# Patient Record
Sex: Male | Born: 1945 | Race: White | Hispanic: No | Marital: Married | Smoking: Former smoker
Health system: Southern US, Community
[De-identification: ages and names within clinical notes are randomized; demographics above are authoritative.]

## PROBLEM LIST (undated history)

## (undated) DIAGNOSIS — E785 Hyperlipidemia, unspecified: Secondary | ICD-10-CM

## (undated) DIAGNOSIS — I10 Essential (primary) hypertension: Secondary | ICD-10-CM

## (undated) HISTORY — PX: TONSILLECTOMY: SUR1361

## (undated) HISTORY — PX: APPENDECTOMY: SHX54

---

## 2012-05-29 DIAGNOSIS — E669 Obesity, unspecified: Secondary | ICD-10-CM | POA: Insufficient documentation

## 2012-05-29 DIAGNOSIS — E876 Hypokalemia: Secondary | ICD-10-CM | POA: Insufficient documentation

## 2012-12-25 ENCOUNTER — Emergency Department (INDEPENDENT_AMBULATORY_CARE_PROVIDER_SITE_OTHER)
Admission: EM | Admit: 2012-12-25 | Discharge: 2012-12-25 | Disposition: A | Discharge status: Home / Self Care | Payer: Medicare Other | Source: Home / Self Care | Attending: Family Medicine | Admitting: Family Medicine

## 2012-12-25 ENCOUNTER — Ambulatory Visit (INDEPENDENT_AMBULATORY_CARE_PROVIDER_SITE_OTHER): Payer: Medicare Other | Admitting: Sports Medicine

## 2012-12-25 ENCOUNTER — Emergency Department (INDEPENDENT_AMBULATORY_CARE_PROVIDER_SITE_OTHER): Payer: Medicare Other

## 2012-12-25 ENCOUNTER — Encounter: Payer: Self-pay | Admitting: Emergency Medicine

## 2012-12-25 DIAGNOSIS — M25539 Pain in unspecified wrist: Secondary | ICD-10-CM

## 2012-12-25 DIAGNOSIS — M25531 Pain in right wrist: Secondary | ICD-10-CM | POA: Insufficient documentation

## 2012-12-25 DIAGNOSIS — M7989 Other specified soft tissue disorders: Secondary | ICD-10-CM

## 2012-12-25 HISTORY — DX: Hyperlipidemia, unspecified: E78.5

## 2012-12-25 HISTORY — DX: Essential (primary) hypertension: I10

## 2012-12-25 MED ORDER — MELOXICAM 15 MG PO TABS: ORAL_TABLET | ORAL | Status: DC

## 2012-12-25 NOTE — Assessment & Plan Note (Signed)
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 Velcro wrist brace for 2 weeks. Mobic. Returning to 3 weeks if no better I will inject the radiocarpal joint.

## 2012-12-25 NOTE — Progress Notes (Signed)
   Subjective:    I'm seeing this patient as a consultation for:  Dr. 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 Pain is worse with essentially any activity. He denies any pain over swelling localized in the volar ulnar aspect of his wrist. He denies any trauma, and has not used any oral medications for this.  Pain does not radiate, mild to moderate.  Past medical history, Surgical history, Family history not pertinant except as noted below, Social history, Allergies, and medications have been entered into the medical record, reviewed, and no changes needed.   Review of Systems: No headache, visual changes, nausea, vomiting, diarrhea, constipation, dizziness, abdominal pain, skin rash, fevers, chills, night sweats, weight loss, swollen lymph nodes, body aches, joint swelling, muscle aches, chest pain, shortness of breath, mood changes, visual or auditory hallucinations.   Objective:   General: Well Developed, well nourished, and in no acute distress.  Neuro/Psych: Alert and oriented x3, extra-ocular muscles intact, able to move all 4 extremities, sensation grossly intact. Skin: Warm and dry, no rashes noted.  Respiratory: Not using accessory muscles, speaking in full sentences, trachea midline.  Cardiovascular: Pulses palpable, no extremity edema. Abdomen: Does not appear distended. Right Wrist: There is some visible fullness over the flexor carpi ulnaris tendon, but this is not painful. There is tenderness to palpation over the radiocarpal joint on the radial aspect, he also did have some pain at the thumb basal joint. Palpation is normal over metacarpals, navicular, lunate, and TFCC; tendons without tenderness/ swelling No snuffbox tenderness. No tenderness over Canal of Guyon. Strength 5/5 in all directions without pain. Negative Finkelstein,  tinel's and phalens. Negative Watson's test.  X-rays were reviewed and showed radiocarpal degenerative changes, moderate to severe.  Impression and Recommendations:   This case required medical decision making of moderate complexity.

## 2012-12-25 NOTE — ED Provider Notes (Signed)
History     CSN: [id number]  Arrival date & time 12/25/12  1119   First MD Initiated Contact with Patient 12/25/12 1143      Chief Complaint  Patient presents with  . Wrist Pain       HPI Comments: Patient complains of approximately two week history of mild swelling in his right forearm and wrist, with only minimal discomfort.  He denies trauma.  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  Patient is a 67 y.o. male presenting with wrist pain. The history is provided by the patient.  Wrist Pain This is a new problem. Episode onset: 2 weeks ago. The problem occurs daily. The problem has not changed since onset.Associated symptoms comments: Swelling in right forearm. Exacerbated by: movement of right wrist. Nothing relieves the symptoms. He has tried nothing for the symptoms.    Past Medical History  Diagnosis Date  . Hypertension   . Hyperlipidemia     Past Surgical History  Procedure Laterality Date  . Tonsillectomy    . Appendectomy      Family History  Problem Relation Age of Onset  . Hypertension Mother   . Stroke Father     History  Substance Use Topics  . Smoking status: Never Smoker   . Smokeless tobacco: Not on file  . Alcohol Use: No      Review of Systems  Allergies  Sulfa antibiotics  Home Medications   Current Outpatient Rx  Name  Route  Sig  Dispense  Refill  . amoxicillin-clarithromycin-lansoprazole (PREVPAC) combo pack   Oral   Take by mouth 2 (two) times daily. Follow package directions.         Marland Kitchen doxazosin (CARDURA) 1 MG tablet   Oral   Take 1 mg by mouth at bedtime.         Marland Kitchen losartan-hydrochlorothiazide (HYZAAR) 100-12.5 MG per tablet   Oral   Take 1 tablet by mouth daily.         . potassium chloride (KLOR-CON) 8 MEQ tablet   Oral   Take 8 mEq by mouth 2 (two) times daily.         . pravastatin (PRAVACHOL) 10 MG tablet   Oral   Take 10 mg by mouth daily.         . meloxicam (MOBIC) 15  MG tablet      One tab PO qAM with breakfast for 2 weeks, then daily prn pain.   30 tablet   3     BP 173/91  Pulse 55  Temp(Src) 97.8 F (36.6 C) (Oral)  Resp 16  Ht 5\' 10"  (1.778 m)  Wt 227 lb (102.967 kg)  BMI 32.57 kg/m2  SpO2 97%  Physical Exam  Nursing note and vitals reviewed. Constitutional: He is oriented to person, place, and time. He appears well-developed and well-nourished. No distress.  Eyes: Conjunctivae are normal. Pupils are equal, round, and reactive to light.  Cardiovascular: Normal heart sounds.   Pulmonary/Chest: Breath sounds normal.  Musculoskeletal:       Right wrist: He exhibits bony tenderness. He exhibits normal range of motion, no tenderness, no swelling, no effusion, no crepitus and no deformity.       Arms: Patient's right forearm reveals non-tender swelling of the volar aspect as noted on diagram.  Right wrist has full range of motion.  There is radiocarpal tenderness on the radial aspect of wrist.  Distal neurovascular function is intact.  Neurological: He is alert and oriented to person, place, and time.  Skin: Skin is warm and dry. No rash noted.    ED Course  Procedures  none  Labs Reviewed - DG Wrist Complete Right (Final result)  Result time: 12/25/12 12:27:04    Final result by Rad Results In Interface (12/25/12 12:27:04)    Narrative:   *RADIOLOGY REPORT*  Clinical Data: Wrist swelling and pain  RIGHT WRIST - COMPLETE 3+ VIEW  Comparison: None.  Findings: No acute fracture or dislocation is noted. No gross soft tissue abnormality is seen. Mild narrowing of the radiocarpal joint is seen.  IMPRESSION: Degenerative change without acute abnormality.   Original Report Authenticated By: Alcide Clever, M.D.       1. Right wrist pain       MDM  Consultation obtained with Dr. Rodney Langton for evaluation and further management.        Lattie Haw, MD 12/28/12 [phone]

## 2012-12-25 NOTE — ED Notes (Signed)
Reports tender/annoying pain in right wrist x 2 weeks, along with some edema; no known injury, but does move heavy equipment.

## 2014-02-23 ENCOUNTER — Encounter: Payer: Self-pay | Admitting: Family Medicine

## 2014-02-23 ENCOUNTER — Ambulatory Visit (INDEPENDENT_AMBULATORY_CARE_PROVIDER_SITE_OTHER): Payer: 59 | Admitting: Family Medicine

## 2014-02-23 VITALS — BP 173/80 | HR 61 | Ht 69.0 in | Wt 249.0 lb

## 2014-02-23 DIAGNOSIS — I10 Essential (primary) hypertension: Secondary | ICD-10-CM | POA: Insufficient documentation

## 2014-02-23 DIAGNOSIS — E785 Hyperlipidemia, unspecified: Secondary | ICD-10-CM

## 2014-02-23 DIAGNOSIS — R0982 Postnasal drip: Secondary | ICD-10-CM

## 2014-02-23 DIAGNOSIS — R5381 Other malaise: Secondary | ICD-10-CM

## 2014-02-23 DIAGNOSIS — R5383 Other fatigue: Secondary | ICD-10-CM

## 2014-02-23 MED ORDER — POTASSIUM CHLORIDE ER 8 MEQ PO TBCR: 8.0000 meq | EXTENDED_RELEASE_TABLET | Freq: Two times a day (BID) | ORAL | Status: DC

## 2014-02-23 MED ORDER — PRAVASTATIN SODIUM 10 MG PO TABS: 10.0000 mg | ORAL_TABLET | Freq: Every day | ORAL | Status: DC

## 2014-02-23 MED ORDER — LOSARTAN POTASSIUM-HCTZ 100-25 MG PO TABS: 1.0000 | ORAL_TABLET | Freq: Every day | ORAL | Status: DC

## 2014-02-23 MED ORDER — DOXAZOSIN MESYLATE 1 MG PO TABS: 1.0000 mg | ORAL_TABLET | Freq: Every day | ORAL | Status: DC

## 2014-02-23 NOTE — Progress Notes (Signed)
CC: Danny KrebsRobert Van Rogers is a 68 y.o. male is here for Establish Care   Subjective: HPI:  "Danny Rogers"  Very pleasant 68 year old here to establish care  Reports a history of hyperlipidemia that spans back for years. Currently taking pravastatin less than most days of the week due to forgetfulness. Denies any known intolerance, myalgia, nor right upper quadrant pain. No formal exercise routine. Tries to watch what he eats but admits for room for improvement. No history of cardiovascular disease. He believes it's been well over a year since his lipid panel was checked last  Reports a history of hypertension currently taking Hyzaar and doxazosin on a daily basis but only for the past 3 days, he been out of this medication for about a month due to being lost to followup with his former practice. Denies any intolerance to medications. He states if he takes both of them a daily basis his blood pressures consistently below 140/90.  He tells me he's been given a diagnosis of white coat hypertension in the past. He takes his blood pressure frequently throughout the week and has noticed it's gone up since he has been off of the medication.  He complains of a dry cough, subjective postnasal drip and nasal congestion. Symptoms have been present for the past month on a daily basis. Mild in severity. Worse when he is outside. Nothing else makes better or worse. Has tried Coricidin HBP without no benefit. Denies blood in sputum or productive cough. Denies wheezing or chest pain or shortness of breath  Review of systems is positive for fatigue, he would like to know if he can have his testosterone checked  Review of Systems - General ROS: negative for - chills, fever, night sweats, weight gain or weight loss Ophthalmic ROS: negative for - decreased vision Psychological ROS: negative for - anxiety or depression ENT ROS: negative for - hearing change, tinnitus or  Hematological and Lymphatic ROS: negative for -  bleeding problems, bruising or swollen lymph nodes Breast ROS: negative Respiratory ROS: no shortness of breath, or wheezing Cardiovascular ROS: no chest pain or dyspnea on exertion Gastrointestinal ROS: no abdominal pain, change in bowel habits, or black or bloody stools Genito-Urinary ROS: negative for - genital discharge, genital ulcers, incontinence or abnormal bleeding from genitals Musculoskeletal ROS: negative for - joint pain or muscle pain Neurological ROS: negative for - headaches or memory loss Dermatological ROS: negative for lumps, mole changes, rash and skin lesion changes  Past Medical History  Diagnosis Date  . Hypertension   . Hyperlipidemia     Past Surgical History  Procedure Laterality Date  . Tonsillectomy    . Appendectomy     Family History  Problem Relation Age of Onset  . Hypertension Mother   . Stroke Father     History   Social History  . Marital Status: Married    Spouse Name: N/A    Number of Children: N/A  . Years of Education: N/A   Occupational History  . Not on file.   Social History Main Topics  . Smoking status: Never Smoker   . Smokeless tobacco: Not on file  . Alcohol Use: No  . Drug Use: No  . Sexual Activity: Not on file   Other Topics Concern  . Not on file   Social History Narrative  . No narrative on file     Objective: BP 173/80  Pulse 61  Ht 5\' 9"  (1.753 m)  Wt 249 lb (112.946 kg)  BMI 36.75 kg/m2  General: Alert and Oriented, No Acute Distress HEENT: Pupils equal, round, reactive to light. Conjunctivae clear.  External ears unremarkable, canals clear with intact TMs with appropriate landmarks.  Middle ear appears open without effusion. Pink inferior turbinates.  Moist mucous membranes, pharynx without inflammation nor lesions however mild postnasal drip with cobblestoning.  Neck supple without palpable lymphadenopathy nor abnormal masses. Lungs: Clear to auscultation bilaterally, no wheezing/ronchi/rales.   Comfortable work of breathing. Good air movement. Cardiac: Regular rate and rhythm. Normal S1/S2.  No murmurs, rubs, nor gallops.   Abdomen: Obese and soft Extremities: No peripheral edema.  Strong peripheral pulses.  Mental Status: No depression, anxiety, nor agitation. Skin: Warm and dry.  Assessment & Plan: Danny MaduroRobert was seen today for establish care.  Diagnoses and associated orders for this visit:  Essential hypertension, benign - COMPLETE METABOLIC PANEL WITH GFR - doxazosin (CARDURA) 1 MG tablet; Take 1 tablet (1 mg total) by mouth at bedtime. - losartan-hydrochlorothiazide (HYZAAR) 100-25 MG per tablet; Take 1 tablet by mouth daily. - potassium chloride (KLOR-CON) 8 MEQ tablet; Take 1 tablet (8 mEq total) by mouth 2 (two) times daily.  Hyperlipidemia - Lipid panel - COMPLETE METABOLIC PANEL WITH GFR - pravastatin (PRAVACHOL) 10 MG tablet; Take 1 tablet (10 mg total) by mouth daily.  Other fatigue - Testosterone, free, total  Post-nasal drip    Essential hypertension: Uncontrolled, I like him to restart his former regimen of antihypertensives above and bring in list of blood pressures at home. If pressures are about 140/90 return for adjustment of antihypertensives. Checking renal function and electrolytes Hyperlipidemia: Due for lipid panel continue a statin pending results Fatigue: Checking testosterone Postnasal drip: Start over-the-counter Flonase or Nasonex  Return in about 3 months (around 05/26/2014) for BP Follow Up with BP Cuff.

## 2014-04-27 ENCOUNTER — Encounter: Payer: Self-pay | Admitting: Family Medicine

## 2014-04-27 DIAGNOSIS — E291 Testicular hypofunction: Secondary | ICD-10-CM | POA: Insufficient documentation

## 2014-04-27 DIAGNOSIS — N289 Disorder of kidney and ureter, unspecified: Secondary | ICD-10-CM | POA: Insufficient documentation

## 2014-04-27 DIAGNOSIS — R739 Hyperglycemia, unspecified: Secondary | ICD-10-CM | POA: Insufficient documentation

## 2014-04-27 LAB — TESTOSTERONE, FREE, TOTAL, SHBG
Sex Hormone Binding: 36 nmol/L (ref 13–71)
TESTOSTERONE FREE: 55.5 pg/mL (ref 47.0–244.0)
TESTOSTERONE-% FREE: 1.9 % (ref 1.6–2.9)
TESTOSTERONE: 298 ng/dL — AB (ref 300–890)

## 2014-04-27 LAB — COMPLETE METABOLIC PANEL WITH GFR
ALBUMIN: 4.2 g/dL (ref 3.5–5.2)
ALK PHOS: 79 U/L (ref 39–117)
ALT: 62 U/L — ABNORMAL HIGH (ref 0–53)
AST: 28 U/L (ref 0–37)
BILIRUBIN TOTAL: 0.7 mg/dL (ref 0.2–1.2)
BUN: 18 mg/dL (ref 6–23)
CO2: 27 mEq/L (ref 19–32)
Calcium: 9.7 mg/dL (ref 8.4–10.5)
Chloride: 108 mEq/L (ref 96–112)
Creat: 1.39 mg/dL — ABNORMAL HIGH (ref 0.50–1.35)
GFR, EST NON AFRICAN AMERICAN: 51 mL/min — AB
GFR, Est African American: 59 mL/min — ABNORMAL LOW
GLUCOSE: 144 mg/dL — AB (ref 70–99)
POTASSIUM: 4 meq/L (ref 3.5–5.3)
SODIUM: 143 meq/L (ref 135–145)
TOTAL PROTEIN: 6.4 g/dL (ref 6.0–8.3)

## 2014-04-27 LAB — LIPID PANEL
CHOL/HDL RATIO: 4.2 ratio
Cholesterol: 154 mg/dL (ref 0–200)
HDL: 37 mg/dL — AB (ref >39)
LDL Cholesterol: 92 mg/dL (ref 0–99)
Triglycerides: 124 mg/dL (ref <150)
VLDL: 25 mg/dL (ref 0–40)

## 2014-05-02 ENCOUNTER — Emergency Department (INDEPENDENT_AMBULATORY_CARE_PROVIDER_SITE_OTHER)
Admission: EM | Admit: 2014-05-02 | Discharge: 2014-05-02 | Disposition: A | Discharge status: Home / Self Care | Payer: 59 | Source: Home / Self Care

## 2014-05-02 ENCOUNTER — Encounter: Payer: Self-pay | Admitting: Emergency Medicine

## 2014-05-02 DIAGNOSIS — N3001 Acute cystitis with hematuria: Secondary | ICD-10-CM

## 2014-05-02 DIAGNOSIS — N3000 Acute cystitis without hematuria: Secondary | ICD-10-CM

## 2014-05-02 LAB — POCT URINALYSIS DIP (MANUAL ENTRY)
LEUKOCYTES UA: NEGATIVE
NITRITE UA: POSITIVE
PH UA: 5 (ref 5–8)
Protein Ur, POC: >=300
Spec Grav, UA: >=1.03 (ref 1.005–1.03)
UROBILINOGEN UA: 2 (ref 0–1)

## 2014-05-02 MED ORDER — CEPHALEXIN 500 MG PO CAPS: 500.0000 mg | ORAL_CAPSULE | Freq: Four times a day (QID) | ORAL | Status: DC

## 2014-05-02 NOTE — ED Notes (Signed)
Reports difficulties with urination x 4 days; dysuria/frequency and minimal output.

## 2014-05-02 NOTE — Discharge Instructions (Signed)

## 2014-05-04 LAB — URINE CULTURE: Colony Count: >=100000

## 2014-05-07 ENCOUNTER — Emergency Department (INDEPENDENT_AMBULATORY_CARE_PROVIDER_SITE_OTHER)
Admission: EM | Admit: 2014-05-07 | Discharge: 2014-05-07 | Disposition: A | Discharge status: Home / Self Care | Payer: 59 | Source: Home / Self Care | Attending: Emergency Medicine | Admitting: Emergency Medicine

## 2014-05-07 ENCOUNTER — Encounter: Payer: Self-pay | Admitting: Emergency Medicine

## 2014-05-07 DIAGNOSIS — N390 Urinary tract infection, site not specified: Secondary | ICD-10-CM

## 2014-05-07 MED ORDER — CIPROFLOXACIN HCL 500 MG PO TABS: 500.0000 mg | ORAL_TABLET | Freq: Two times a day (BID) | ORAL | Status: DC

## 2014-05-07 NOTE — ED Notes (Signed)
Seen earlier in week with urinary sx.   Treated and is back today d/t continuing discomfort and sx.

## 2014-05-07 NOTE — ED Provider Notes (Addendum)
CSN: [id number]     Arrival date & time 05/07/14  0902 History   None    Chief Complaint  Patient presents with  . Urinary Tract Infection   (Consider location/radiation/quality/duration/timing/severity/associated sxs/prior Treatment) HPI Danny Rogers is a 68 y.o. male who presents today with UTI symptoms for a week.  He was here a few days ago, given Keflex which worked for a day, then symptoms returned.  + dysuria No frequency No urgency No hematuria No penile discharge No fever/chills No lower abdominal pain No back pain No fatigue    Past Medical History  Diagnosis Date  . Hypertension   . Hyperlipidemia    Past Surgical History  Procedure Laterality Date  . Tonsillectomy    . Appendectomy     Family History  Problem Relation Age of Onset  . Hypertension Mother   . Stroke Father    History  Substance Use Topics  . Smoking status: Former Smoker    Quit date: 02/24/1988  . Smokeless tobacco: Not on file  . Alcohol Use: No    Review of Systems  All other systems reviewed and are negative.   Allergies  Cephalosporins; Penicillins; and Sulfa antibiotics  Home Medications   Prior to Admission medications   Medication Sig Start Date End Date Taking? Authorizing Provider  cephALEXin (KEFLEX) 500 MG capsule Take 1 capsule (500 mg total) by mouth 4 (four) times daily. 05/02/14   Elson Areas, PA-C  doxazosin (CARDURA) 1 MG tablet Take 1 tablet (1 mg total) by mouth at bedtime. 02/23/14   Sean Hommel, DO  losartan-hydrochlorothiazide (HYZAAR) 100-25 MG per tablet Take 1 tablet by mouth daily. 02/23/14   Laren Boom, DO  meloxicam (MOBIC) 15 MG tablet One tab PO qAM with breakfast for 2 weeks, then daily prn pain. 12/25/12   Monica Becton, MD  potassium chloride (KLOR-CON) 8 MEQ tablet Take 1 tablet (8 mEq total) by mouth 2 (two) times daily. 02/23/14   Sean Hommel, DO  pravastatin (PRAVACHOL) 10 MG tablet Take 1 tablet (10 mg total) by mouth daily. 02/23/14   Laren Boom,  DO   There were no vitals taken for this visit. Physical Exam  Nursing note and vitals reviewed. Constitutional: He is oriented to person, place, and time. He appears well-developed and well-nourished.  HENT:  Head: Normocephalic and atraumatic.  Eyes: No scleral icterus.  Neck: Neck supple.  Cardiovascular: Regular rhythm and normal heart sounds.   Pulmonary/Chest: Effort normal and breath sounds normal. No respiratory distress.  Abdominal: Soft. Normal appearance and bowel sounds are normal. He exhibits no mass. There is no rebound, no guarding and no CVA tenderness.  Neurological: He is alert and oriented to person, place, and time.  Skin: Skin is warm and dry.  Psychiatric: He has a normal mood and affect. His speech is normal.    ED Course  Procedures (including critical care time) Labs Review Labs Reviewed - No data to display  Imaging Review No results found.   MDM   1. Urinary tract infection, site not specified    1) Take the prescribed antibiotic as directed.  Stop Keflex since culture shows ampicillin resistance, and instead will put on Cipro x5 days. 2) No further UA or culture was done. 3) Follow up with your PCP or urologist if not improving or if worsening symptoms.   Marlaine Hind, MD 05/07/14 [phone]  The pharmacy called and said that he was previously on Cipro.  Initially he was written for  Keflex but since he was allergic and he was switched to Cipro.  I was unaware of this.  Instead I will place him on doxycycline for 7 days which shows good sensitivity on the culture.  If still not improving within a few days, likely needs referral to urology or return to his PCP since treating with two sensitive antibiotics should cure his cystitis.  Marlaine Hind, MD 05/07/14 [phone]

## 2014-05-09 ENCOUNTER — Encounter: Payer: Self-pay | Admitting: Family Medicine

## 2014-05-09 ENCOUNTER — Ambulatory Visit (INDEPENDENT_AMBULATORY_CARE_PROVIDER_SITE_OTHER): Payer: 59 | Admitting: Family Medicine

## 2014-05-09 VITALS — BP 143/79 | HR 64 | Temp 97.9°F | Wt 233.0 lb

## 2014-05-09 DIAGNOSIS — N410 Acute prostatitis: Secondary | ICD-10-CM

## 2014-05-09 MED ORDER — LEVOFLOXACIN 500 MG PO TABS: 500.0000 mg | ORAL_TABLET | Freq: Every day | ORAL | Status: DC

## 2014-05-09 NOTE — Progress Notes (Signed)
CC: Danny Rogers is a 68 y.o. male is here for f/u UTI   Subjective: HPI:  Complaints of dysuria localized in the shaft of the penis and also urinary hesitancy that began last week. He was seen in our urgent care Center on the 14th and had a urinalysis highly suggestive of a urinary tract infection.  He was originally prescribed Keflex however per his account his pharmacist noticed this and his allergy to cephalosporin's and somehow this medication was switched to Cipro however this is not documented in our EMR.  Symptoms were slightly improving however last Friday symptoms gradually got worse with moderate to severe urinary hesitancy and he was seen back in our urgent care Center and prescribed doxycycline.  Patient reports no improvement while on doxycycline and is starting to feel subjective fevers and chills with persistent dysuria and urinary hesitancy.  Denies confusion, flank pain, abdominal pain, constipation, confusion, shortness of breath nor rash. Review of systems is positive for loose stools  Review Of Systems Outlined In HPI  Past Medical History  Diagnosis Date  . Hypertension   . Hyperlipidemia     Past Surgical History  Procedure Laterality Date  . Tonsillectomy    . Appendectomy     Family History  Problem Relation Age of Onset  . Hypertension Mother   . Stroke Father     History   Social History  . Marital Status: Married    Spouse Name: N/A    Number of Children: N/A  . Years of Education: N/A   Occupational History  . Not on file.   Social History Main Topics  . Smoking status: Former Smoker    Quit date: 02/24/1988  . Smokeless tobacco: Not on file  . Alcohol Use: No  . Drug Use: No  . Sexual Activity: Not Currently    Partners: Female   Other Topics Concern  . Not on file   Social History Narrative  . No narrative on file     Objective: BP 143/79  Pulse 64  Temp(Src) 97.9 F (36.6 C) (Oral)  Wt 233 lb (105.688 kg)  General: Alert  and Oriented, No Acute Distress HEENT: Pupils equal, round, reactive to light. Conjunctivae clear.  Moist mucous membranes pharynx unremarkable Lungs: clear comfortable work of breathing Cardiac: Regular rate and rhythm.  Back: No CVA tenderness Extremities: No peripheral edema.  Strong peripheral pulses.  Mental Status: No depression, anxiety, nor agitation. Skin: Warm and dry.  Assessment & Plan: Danny Rogers was seen today for f/u uti.  Diagnoses and associated orders for this visit:  Acute bacterial prostatitis - Urine Culture - levofloxacin (LEVAQUIN) 500 MG tablet; Take 1 tablet (500 mg total) by mouth daily.    Urine culture reviewed, stop doxycycline switching to levofloxacin.  We'll obtain a repeat culture to ensure there is not a new bacteria complicating treatment.Signs and symptoms requring emergent/urgent reevaluation were discussed with the patient.  25 minutes spent face-to-face during visit today of which at least 50% was counseling or coordinating care regarding: 1. Acute bacterial prostatitis      Return if symptoms worsen or fail to improve.

## 2014-05-11 LAB — URINE CULTURE
Colony Count: NO GROWTH
Organism ID, Bacteria: NO GROWTH

## 2014-05-25 NOTE — ED Provider Notes (Signed)
CSN: [id number]     Arrival date & time 05/02/14  0909 History   None    Chief Complaint  Patient presents with  . Dysuria  . Urinary Frequency  . Constipation   (Consider location/radiation/quality/duration/timing/severity/associated sxs/prior Treatment) Patient is a 68 y.o. male presenting with dysuria, frequency, and constipation. The history is provided by the patient. No language interpreter was used.  Dysuria This is a new problem. The current episode started more than 2 days ago. The problem occurs constantly. The problem has been gradually worsening. Pertinent negatives include no abdominal pain. Nothing aggravates the symptoms. Nothing relieves the symptoms. He has tried nothing for the symptoms. The treatment provided no relief.  Urinary Frequency Pertinent negatives include no abdominal pain.  Constipation Associated symptoms: dysuria   Associated symptoms: no abdominal pain   Pt reports burning with urination  Past Medical History  Diagnosis Date  . Hypertension   . Hyperlipidemia    Past Surgical History  Procedure Laterality Date  . Tonsillectomy    . Appendectomy     Family History  Problem Relation Age of Onset  . Hypertension Mother   . Stroke Father    History  Substance Use Topics  . Smoking status: Former Smoker    Quit date: 02/24/1988  . Smokeless tobacco: Not on file  . Alcohol Use: No    Review of Systems  Gastrointestinal: Positive for constipation. Negative for abdominal pain.  Genitourinary: Positive for dysuria and frequency.  All other systems reviewed and are negative.   Allergies  Cephalosporins; Penicillins; and Sulfa antibiotics  Home Medications   Prior to Admission medications   Medication Sig Start Date End Date Taking? Authorizing Provider  doxazosin (CARDURA) 1 MG tablet Take 1 tablet (1 mg total) by mouth at bedtime. 02/23/14   Sean Hommel, DO  levofloxacin (LEVAQUIN) 500 MG tablet Take 1 tablet (500 mg total) by mouth  daily. 05/09/14   Sean Hommel, DO  losartan-hydrochlorothiazide (HYZAAR) 100-25 MG per tablet Take 1 tablet by mouth daily. 02/23/14   Laren Boom, DO  meloxicam (MOBIC) 15 MG tablet One tab PO qAM with breakfast for 2 weeks, then daily prn pain. 12/25/12   Monica Becton, MD  potassium chloride (KLOR-CON) 8 MEQ tablet Take 1 tablet (8 mEq total) by mouth 2 (two) times daily. 02/23/14   Sean Hommel, DO  pravastatin (PRAVACHOL) 10 MG tablet Take 1 tablet (10 mg total) by mouth daily. 02/23/14   Sean Hommel, DO   BP 161/69  Pulse 78  Temp(Src) 98.2 F (36.8 C) (Oral)  Resp 16  Ht 5\' 10"  (1.778 m)  Wt 237 lb (107.502 kg)  BMI 34.01 kg/m2 Physical Exam  Nursing note and vitals reviewed. Constitutional: He is oriented to person, place, and time. He appears well-developed and well-nourished.  HENT:  Head: Normocephalic.  Eyes: EOM are normal.  Neck: Normal range of motion.  Cardiovascular: Normal rate and normal heart sounds.   Pulmonary/Chest: Effort normal.  Abdominal: Soft.  Musculoskeletal: Normal range of motion.  Neurological: He is alert and oriented to person, place, and time.  Psychiatric: He has a normal mood and affect.    ED Course  Procedures (including critical care time) Labs Review Labs Reviewed  URINE CULTURE   Narrative:    Performed at:  [facility]                [address], [address]  [place], [place] [address]  POCT URINALYSIS DIP (MANUAL ENTRY)    Imaging Review No results found.   MDM   1. Acute cystitis with hematuria    Pt given rx for keflex.    Pt advised to follow up with his MD Urine culture pending  (This note documented for 9/14 visit on 10/7)     Elson AreasLeslie K Sofia, PA-C 05/25/14 1017

## 2014-05-26 ENCOUNTER — Encounter: Payer: Self-pay | Admitting: Family Medicine

## 2014-05-26 ENCOUNTER — Ambulatory Visit (INDEPENDENT_AMBULATORY_CARE_PROVIDER_SITE_OTHER): Payer: 59 | Admitting: Family Medicine

## 2014-05-26 VITALS — BP 158/85 | HR 66 | Wt 233.0 lb

## 2014-05-26 DIAGNOSIS — N400 Enlarged prostate without lower urinary tract symptoms: Secondary | ICD-10-CM

## 2014-05-26 DIAGNOSIS — I10 Essential (primary) hypertension: Secondary | ICD-10-CM

## 2014-05-26 DIAGNOSIS — Z23 Encounter for immunization: Secondary | ICD-10-CM

## 2014-05-26 DIAGNOSIS — R739 Hyperglycemia, unspecified: Secondary | ICD-10-CM

## 2014-05-26 DIAGNOSIS — E291 Testicular hypofunction: Secondary | ICD-10-CM

## 2014-05-26 DIAGNOSIS — Z79899 Other long term (current) drug therapy: Secondary | ICD-10-CM

## 2014-05-26 LAB — HEMOGLOBIN: HEMOGLOBIN: 13.3 g/dL (ref 13.0–17.0)

## 2014-05-26 MED ORDER — LOSARTAN POTASSIUM-HCTZ 100-25 MG PO TABS: 1.0000 | ORAL_TABLET | Freq: Every day | ORAL | Status: DC

## 2014-05-26 MED ORDER — DOXAZOSIN MESYLATE 4 MG PO TABS: 4.0000 mg | ORAL_TABLET | Freq: Every day | ORAL | Status: DC

## 2014-05-26 NOTE — Progress Notes (Signed)
CC: Danny KrebsRobert Van Schaick is a 68 y.o. male is here for Hypertension   Subjective: HPI:  Followup essential hypertension: Continues to take Hyzaar on a daily basis along with doxazosin 2 mg daily. He's been taking his blood pressures at home and while on the road he shows me a blood pressure cuff that has stored readings in the normotensive and pre-hypertensive range on a daily basis. He denies chest pain shortness of breath orthopnea nor peripheral edema.  Followup hyperglycemia: Back in September he had a glucose of 144, he does not believe it is ever had an A1c checked before. He reports polyuria but denies polydipsia or polyphagia. He denies poorly healing wounds nor vision loss.  Followup hypogonadism: A testosterone level checked last month revealed a moderately low testosterone level. He reports difficulty with initiating erections, moderate daytime fatigue all of which has been present for matter of years and has not been getting better or worse over the past months. No interventions at. He has no genitourinary complaints other than that described above and the prostate concerns below  Patient reports that ever since his prostate infection he's had a weak stream and is awakening 2-3 times a night to urinate. He's never had this issue before. He had dysuria and penile pain last month however this has completely resolved. He denies any dysuria or blood in the urine. No family history of prostate cancer. Denies fevers, chills, nausea, abdominal pain   Review Of Systems Outlined In HPI  Past Medical History  Diagnosis Date  . Hypertension   . Hyperlipidemia     Past Surgical History  Procedure Laterality Date  . Tonsillectomy    . Appendectomy     Family History  Problem Relation Age of Onset  . Hypertension Mother   . Stroke Father     History   Social History  . Marital Status: Married    Spouse Name: N/A    Number of Children: N/A  . Years of Education: N/A   Occupational  History  . Not on file.   Social History Main Topics  . Smoking status: Former Smoker    Quit date: 02/24/1988  . Smokeless tobacco: Not on file  . Alcohol Use: No  . Drug Use: No  . Sexual Activity: Not Currently    Partners: Female   Other Topics Concern  . Not on file   Social History Narrative  . No narrative on file     Objective: BP 158/85  Pulse 66  Wt 233 lb (105.688 kg)  General: Alert and Oriented, No Acute Distress HEENT: Pupils equal, round, reactive to light. Conjunctivae clear.  Moist mucous membranes pharynx unremarkable Lungs: Clear to auscultation bilaterally, no wheezing/ronchi/rales.  Comfortable work of breathing. Good air movement. Cardiac: Regular rate and rhythm. Normal S1/S2.  No murmurs, rubs, nor gallops.   Extremities: No peripheral edema.  Strong peripheral pulses.  Mental Status: No depression, anxiety, nor agitation. Skin: Warm and dry.  Assessment & Plan: Molly MaduroRobert was seen today for hypertension.  Diagnoses and associated orders for this visit:  BPH (benign prostatic hyperplasia) - doxazosin (CARDURA) 4 MG tablet; Take 1 tablet (4 mg total) by mouth at bedtime. - PSA  Encounter for immunization  Essential hypertension, benign - doxazosin (CARDURA) 4 MG tablet; Take 1 tablet (4 mg total) by mouth at bedtime. - losartan-hydrochlorothiazide (HYZAAR) 100-25 MG per tablet; Take 1 tablet by mouth daily.  Hyperglycemia - Hemoglobin A1c  Hypogonadism male - Testosterone - PSA - Hemoglobin  High risk medication use - PSA    BPH: Suspect he has some residual prostate inflammation from his recent infection which clinically has now cleared. Increasing doxazosin for worsening BPH. Checking a PSA for his interest in possibly starting testosterone therapy Essential hypertension: Controlled continue Hyzaar and doxazosin Hyperglycemia: Obtaining an A1c Hypogonadism: Time was taken to discuss the risks and benefits of testosterone  supplementation. He's interested in starting this is a candidate therefore checking hemoglobin and repeat testosterone for confirmation of hypogonadism  40 minutes spent face-to-face during visit today of which at least 50% was counseling or coordinating care regarding: 1. BPH (benign prostatic hyperplasia)   2. Encounter for immunization   3. Essential hypertension, benign   4. Hyperglycemia   5. Hypogonadism male   6. High risk medication use      Return in about 3 months (around 08/26/2014).

## 2014-05-26 NOTE — ED Provider Notes (Signed)
Medical history/examination/treatment/procedure(s) were performed by non-physician provider and as supervising physician I was immediately available for consultation/collaboration.  Lajean Manesavid Massey, MD 05/26/14 [phone]

## 2014-05-27 ENCOUNTER — Encounter: Payer: Self-pay | Admitting: Family Medicine

## 2014-05-27 ENCOUNTER — Telehealth: Payer: Self-pay | Admitting: Family Medicine

## 2014-05-27 DIAGNOSIS — E119 Type 2 diabetes mellitus without complications: Secondary | ICD-10-CM | POA: Insufficient documentation

## 2014-05-27 DIAGNOSIS — R972 Elevated prostate specific antigen [PSA]: Secondary | ICD-10-CM

## 2014-05-27 LAB — HEMOGLOBIN A1C
HEMOGLOBIN A1C: 6.6 % — AB (ref <5.7)
MEAN PLASMA GLUCOSE: 143 mg/dL — AB (ref <117)

## 2014-05-27 LAB — PSA: PSA: 4.61 ng/mL — AB (ref <=4.00)

## 2014-05-27 LAB — TESTOSTERONE: TESTOSTERONE: 292 ng/dL — AB (ref 300–890)

## 2014-05-27 NOTE — Telephone Encounter (Signed)
Sue Lushndrea, Will you please let patient know that his testosterone was again in the deficient range.  His prostate PSA test was elevated and although this could be because of his recent prostate infection it would be wise to return in one month to have this rechecked (lab slip in your inbox).  Since this is elevated starting testosterone supplementation is not recommended until this is back in the normal range.  Also, his A1c 3 month average blood sugar was just barely in the diabetic range.  Not high enough to warrant blood sugar medication but it stresses the importance of  engaging in 30-45 minutes of moderate exercise most days of the week.

## 2014-05-30 NOTE — Telephone Encounter (Signed)
No answer at cell, wife answered home phone and left a message for husband to call back

## 2014-05-31 NOTE — Telephone Encounter (Signed)
Called cell and wireless customer not avail; home # must have had fax machine hooked to it called it twice and it sounded like fax and this was the same # I spoke with wife yesterday

## 2014-06-02 NOTE — Telephone Encounter (Signed)
Pt.notified

## 2014-08-12 LAB — PSA, TOTAL AND FREE
PSA, Free Pct: 15 % — ABNORMAL LOW (ref >25)
PSA, Free: 0.55 ng/mL
PSA: 3.77 ng/mL (ref <=4.00)

## 2014-08-26 ENCOUNTER — Ambulatory Visit: Payer: 59 | Admitting: Family Medicine

## 2014-10-07 ENCOUNTER — Ambulatory Visit (INDEPENDENT_AMBULATORY_CARE_PROVIDER_SITE_OTHER): Payer: 59 | Admitting: Family Medicine

## 2014-10-07 ENCOUNTER — Encounter: Payer: Self-pay | Admitting: Family Medicine

## 2014-10-07 VITALS — BP 151/72 | HR 70 | Wt 242.0 lb

## 2014-10-07 DIAGNOSIS — L219 Seborrheic dermatitis, unspecified: Secondary | ICD-10-CM | POA: Diagnosis not present

## 2014-10-07 MED ORDER — KETOCONAZOLE 2 % EX CREA: 1.0000 "application " | TOPICAL_CREAM | Freq: Two times a day (BID) | CUTANEOUS | Status: DC

## 2014-10-07 NOTE — Progress Notes (Signed)
CC: Danny KrebsRobert Van Rogers is a [age] y.o. male is here for rash on face   Subjective: HPI:  Rash on the face localized on the bridge of the nose beneath the eyes and above the eyebrows. It's been present for 2 months now. It seemed like it was getting better on its own back in January however has worsened over the past 2 or 3 weeks. No interventions as of yet. No changed personal care products. It is itchy and flaky. Otherwise it does not bother him. He denies skin changes elsewhere or eye pain. Denies ocular discharge. No nasal complaints. Denies fevers chills or rash elsewhere. He had this as a child but it resolved on its own. He denies fevers, chills. He is uncertain whether or not it gets better or worse with alcohol   Review Of Systems Outlined In HPI  Past Medical History  Diagnosis Date  . Hypertension   . Hyperlipidemia     Past Surgical History  Procedure Laterality Date  . Tonsillectomy    . Appendectomy     Family History  Problem Relation Age of Onset  . Hypertension Mother   . Stroke Father     History   Social History  . Marital Status: Married    Spouse Name: N/A  . Number of Children: N/A  . Years of Education: N/A   Occupational History  . Not on file.   Social History Main Topics  . Smoking status: Former Smoker    Quit date: 02/24/1988  . Smokeless tobacco: Not on file  . Alcohol Use: No  . Drug Use: No  . Sexual Activity:    Partners: Female   Other Topics Concern  . Not on file   Social History Narrative     Objective: BP 151/72 mmHg  Pulse 70  Wt 242 lb (109.77 kg)  General: Alert and Oriented, No Acute Distress HEENT: Pupils equal, round, reactive to light. Conjunctivae clear.  Moist mucous membranes Lungs: Clear and comfortable work of breathing Cardiac: Regular rate and rhythm. Skin: Warm and dry. Erythematous and scaly confluent patch beneath the eyes between the eyes and above the eyebrows.  Assessment & Plan: Danny Rogers was seen today  for rash on face.  Diagnoses and all orders for this visit:  Seborrheic dermatitis Orders: -     ketoconazole (NIZORAL) 2 % cream; Apply 1 application topically 2 (two) times daily. For up to 4 weeks.   Suspect seborrheic dermatitis therefore start ketoconazole. Asked him to call me if he notices that alcohol intensifies the rash. If no better in 1 week will refer to dermatology.   Return if symptoms worsen or fail to improve.

## 2014-12-16 ENCOUNTER — Other Ambulatory Visit: Payer: Self-pay | Admitting: Family Medicine

## 2014-12-16 DIAGNOSIS — N400 Enlarged prostate without lower urinary tract symptoms: Secondary | ICD-10-CM

## 2014-12-16 DIAGNOSIS — I10 Essential (primary) hypertension: Secondary | ICD-10-CM

## 2014-12-16 MED ORDER — DOXAZOSIN MESYLATE 4 MG PO TABS: 4.0000 mg | ORAL_TABLET | Freq: Every day | ORAL | Status: DC

## 2015-01-27 ENCOUNTER — Encounter: Payer: Self-pay | Admitting: Family Medicine

## 2015-01-27 ENCOUNTER — Telehealth: Payer: Self-pay | Admitting: Family Medicine

## 2015-01-27 ENCOUNTER — Ambulatory Visit (INDEPENDENT_AMBULATORY_CARE_PROVIDER_SITE_OTHER): Payer: 59 | Admitting: Family Medicine

## 2015-01-27 VITALS — BP 153/73 | HR 73 | Wt 241.0 lb

## 2015-01-27 DIAGNOSIS — E785 Hyperlipidemia, unspecified: Secondary | ICD-10-CM

## 2015-01-27 DIAGNOSIS — L719 Rosacea, unspecified: Secondary | ICD-10-CM

## 2015-01-27 DIAGNOSIS — I10 Essential (primary) hypertension: Secondary | ICD-10-CM

## 2015-01-27 MED ORDER — PRAVASTATIN SODIUM 10 MG PO TABS: 10.0000 mg | ORAL_TABLET | Freq: Every day | ORAL | Status: DC

## 2015-01-27 MED ORDER — LOSARTAN POTASSIUM-HCTZ 100-25 MG PO TABS: 1.0000 | ORAL_TABLET | Freq: Every day | ORAL | Status: DC

## 2015-01-27 MED ORDER — METRONIDAZOLE 0.75 % EX GEL: 1.0000 "application " | Freq: Two times a day (BID) | CUTANEOUS | Status: DC

## 2015-01-27 MED ORDER — POTASSIUM CHLORIDE ER 8 MEQ PO TBCR
8.0000 meq | EXTENDED_RELEASE_TABLET | Freq: Two times a day (BID) | ORAL | Status: DC
Start: 2015-01-27 — End: 2015-08-15

## 2015-01-27 MED ORDER — AMLODIPINE BESYLATE 5 MG PO TABS: 5.0000 mg | ORAL_TABLET | Freq: Every day | ORAL | Status: DC

## 2015-01-27 NOTE — Telephone Encounter (Signed)
Please call patient: His blood pressure was quite high at his office visit on Friday. When I looked back at his been high the last 3 times that he's been here. I did refill his blood pressure pill but I am also setting over a new prescription for amlodipine. This is a second blood pressure pill to take with his current one. He can take them at the same time or he can take 1 in the morning and 1 in the evening if he would like. I would like him to come back in about a month to follow-up with Dr. Ivan Anchors to recheck his blood pressure.

## 2015-01-27 NOTE — Patient Instructions (Signed)
Rosacea Rosacea is a long-term (chronic) condition that affects the skin of the face (cheeks, nose, brow, and chin) and sometimes the eyes. Rosacea causes the blood vessels near the surface of the skin to enlarge, resulting in redness. This condition usually begins after age 71. It occurs most often in light-skinned women. Without treatment, rosacea tends to get worse over time. There is no cure for rosacea, but treatment can help control your symptoms. CAUSES  The cause is unknown. It is thought that some people may inherit a tendency to develop rosacea. Certain triggers can make your rosacea worse, including:  Hot baths.  Exercise.  Sunlight.  Very hot or cold temperatures.  Hot or spicy foods and drinks.  Drinking alcohol.  Stress.  Taking blood pressure medicine.  Long-term use of topical steroids on the face. SYMPTOMS   Redness of the face.  Red bumps or pimples on the face.  Red, enlarged nose (rhinophyma).  Blushing easily.  Red lines on the skin.  Irritated or burning feeling in the eyes.  Swollen eyelids. DIAGNOSIS  Your caregiver can usually tell what is wrong by asking about your symptoms and performing a physical exam. TREATMENT  Avoiding triggers is an important part of treatment. You will also need to see a skin specialist (dermatologist) who can develop a treatment plan for you. The goals of treatment are to control your condition and to improve the appearance of your skin. It may take several weeks or months of treatment before you notice an improvement in your skin. Even after your skin improves, you will likely need to continue treatment to prevent your rosacea from coming back. Treatment methods may include:  Using sunscreen or sunblock daily to protect the skin.  Antibiotic medicine, such as metronidazole, applied directly to the skin.  Antibiotics taken by mouth. This is usually prescribed if you have eye problems from your rosacea.  Laser surgery  to improve the appearance of the skin. This surgery can reduce the appearance of red lines on the skin and can remove excess tissue from the nose to reduce its size. HOME CARE INSTRUCTIONS  Avoid things that seem to trigger your flare-ups.  If you are given antibiotics, take them as directed. Finish them even if you start to feel better.  Use a gentle facial cleanser that does not contain alcohol.  You may use a mild facial moisturizer.  Use a sunscreen or sunblock with SPF 30 or greater.  Wear a green-tinted foundation powder to conceal redness, if needed. Choose cosmetics that are noncomedogenic. This means they do not block your pores.  If your eyelids are affected, apply warm compresses to the eyelids. Do this up to 4 times a day or as directed by your caregiver. SEEK MEDICAL CARE IF:  Your skin problems get worse.  You feel depressed.  You lose your appetite.  You have trouble concentrating.  You have problems with your eyes, such as redness or itching. MAKE SURE YOU:  Understand these instructions.  Will watch your condition.  Will get help right away if you are not doing well or get worse. Document Released: 09/12/2004 Document Revised: 02/04/2012 Document Reviewed: 07/16/2011 Exit[employer] Patient Information 2015 [employer]. This information is not intended to replace advice given to you by your health care provider. Make sure you discuss any questions you have with your health care provider.  

## 2015-01-27 NOTE — Progress Notes (Signed)
   Subjective:    Patient ID: Danny Rogers, male    DOB: [birth year], 69 y.o.   MRN: [id number]  HPI Patients here to evaluate for rashes on his face. He was seen about 6 months, office and initially diagnosed with seborrheic dermatitis. He was given ketoconazole cream which she had been using. He feels like it's not helping or has been affected.   Review of Systems     Objective:   Physical Exam  Skin:  Suspect 2 AKs on his nose but difficult to tell as many of the lesions are excoriated. He also has scattered small 3-4 mm erythematous papules over the nasal bridge and on the cheeks under both eyes, in between the eyebrows and onto his forehead. Again a lot of these are excoriated. One has a small pustule.          Assessment & Plan:  Rosacea-based on today's exam it's more consistent with rosacea, papular type. We will try treatment with topical metronidazole gel. I did warn that it isn't an alcohol base and can be somewhat drying so he can let us know. Can apply once or twice daily. If not improving then please let us know. He does have a couple lesions on his nose that are suspicious for an actinic keratosis but they were excoriated today's it was difficult to say. Encouraged him to keep an eye on those if they do not heal than they need to be treated with cryotherapy.  Hypertension-uncontrolled. Will add amlodipine to his losartan HCT. Recommend follow-up in 4 weeks for repeat blood pressure check with PCP.

## 2015-01-31 NOTE — Telephone Encounter (Signed)
Left message on pts VM requesting a call back. 

## 2015-02-01 NOTE — Telephone Encounter (Signed)
Left second VM on pts home phone requesting a call back.

## 2015-02-08 NOTE — Telephone Encounter (Signed)
Left VM requesting a call back for results.  

## 2015-02-24 NOTE — Telephone Encounter (Signed)
Made several attempts to contact pt. Will send letter.

## 2015-04-08 ENCOUNTER — Emergency Department (INDEPENDENT_AMBULATORY_CARE_PROVIDER_SITE_OTHER): Payer: 59

## 2015-04-08 ENCOUNTER — Emergency Department (INDEPENDENT_AMBULATORY_CARE_PROVIDER_SITE_OTHER)
Admission: EM | Admit: 2015-04-08 | Discharge: 2015-04-08 | Disposition: A | Discharge status: Home / Self Care | Payer: 59 | Source: Home / Self Care | Attending: Family Medicine | Admitting: Family Medicine

## 2015-04-08 ENCOUNTER — Encounter: Payer: Self-pay | Admitting: Emergency Medicine

## 2015-04-08 DIAGNOSIS — M7050 Other bursitis of knee, unspecified knee: Secondary | ICD-10-CM

## 2015-04-08 DIAGNOSIS — M7150 Other bursitis, not elsewhere classified, unspecified site: Secondary | ICD-10-CM

## 2015-04-08 DIAGNOSIS — M25562 Pain in left knee: Secondary | ICD-10-CM | POA: Diagnosis not present

## 2015-04-08 DIAGNOSIS — M1712 Unilateral primary osteoarthritis, left knee: Secondary | ICD-10-CM | POA: Diagnosis not present

## 2015-04-08 MED ORDER — MELOXICAM 15 MG PO TABS: 15.0000 mg | ORAL_TABLET | Freq: Every day | ORAL | Status: DC

## 2015-04-08 NOTE — ED Provider Notes (Signed)
CSN: [id number]     Arrival date & time 04/08/15  1256 History   First MD Initiated Contact with Patient 04/08/15 1307     Chief Complaint  Patient presents with  . Knee Pain      HPI Comments: Patient complains of pain in his left medial knee for one month.  The pain is worse when walking, and occasionally feels as if it may give way.  No recent injury but he recalls slipping on wet steel steps about 10 months ago.  He has pain after sitting for extended length of time.  Patient is a 69 y.o. male presenting with knee pain. The history is provided by the patient.  Knee Pain Location:  Knee Time since incident:  1 month Injury: no   Knee location:  L knee Pain details:    Quality:  Aching   Radiates to:  Does not radiate   Severity:  Mild   Onset quality:  Gradual   Duration:  1 month   Timing:  Constant   Progression:  Worsening Chronicity:  New Prior injury to area:  Yes Relieved by:  Nothing Worsened by:  Exercise (sitting) Ineffective treatments:  None tried Associated symptoms: stiffness and swelling   Associated symptoms: no back pain, no decreased ROM, no fever, no muscle weakness and no tingling     Past Medical History  Diagnosis Date  . Hypertension   . Hyperlipidemia    Past Surgical History  Procedure Laterality Date  . Tonsillectomy    . Appendectomy     Family History  Problem Relation Age of Onset  . Hypertension Mother   . Stroke Father    Social History  Substance Use Topics  . Smoking status: Former Smoker    Quit date: 02/24/1988  . Smokeless tobacco: None  . Alcohol Use: Yes     Comment: 2-3 weekly    Review of Systems  Constitutional: Negative for fever.  Musculoskeletal: Positive for stiffness. Negative for back pain.  All other systems reviewed and are negative.   Allergies  Cephalosporins; Penicillins; and Sulfa antibiotics  Home Medications   Prior to Admission medications   Medication Sig Start Date End Date Taking?  Authorizing Provider  amLODipine (NORVASC) 5 MG tablet Take 1 tablet (5 mg total) by mouth daily. 01/27/15   Agapito Games, MD  doxazosin (CARDURA) 4 MG tablet Take 1 tablet (4 mg total) by mouth at bedtime. 12/16/14   Sean Hommel, DO  losartan-hydrochlorothiazide (HYZAAR) 100-25 MG per tablet Take 1 tablet by mouth daily. 01/27/15   Agapito Games, MD  meloxicam (MOBIC) 15 MG tablet Take 1 tablet (15 mg total) by mouth daily. Take with food each morning 04/08/15   Lattie Haw, MD  potassium chloride (KLOR-CON) 8 MEQ tablet Take 1 tablet (8 mEq total) by mouth 2 (two) times daily. 01/27/15   Agapito Games, MD  pravastatin (PRAVACHOL) 10 MG tablet Take 1 tablet (10 mg total) by mouth daily. 01/27/15   Agapito Games, MD   BP 156/77 mmHg  Pulse 69  Temp(Src) 98.2 F (36.8 C) (Oral)  Ht  (1.778 m)  Wt 250 lb (113.399 kg)  BMI 35.87 kg/m2  SpO2 92% Physical Exam  Constitutional: He is oriented to person, place, and time. He appears well-developed and well-nourished. No distress.  HENT:  Head: Atraumatic.  Eyes: Pupils are equal, round, and reactive to light.  Neck: Normal range of motion.  Cardiovascular: Normal heart sounds.  Pulmonary/Chest: Breath sounds normal.  Abdominal: There is no tenderness.  Musculoskeletal:       Left knee: He exhibits decreased range of motion. He exhibits no swelling, no ecchymosis, no deformity, no erythema, normal alignment, no LCL laxity, normal patellar mobility, normal meniscus and no MCL laxity. Tenderness found. Medial joint line tenderness noted. No patellar tendon tenderness noted.       Legs: Patient has distinct tenderness to palpation over the left medial joint line, the left edge of patella, and pes anserine bursa.  Joint is stable, negative McMurray test.    Neurological: He is alert and oriented to person, place, and time.  Skin: Skin is warm and dry.  Nursing note and vitals reviewed.   ED Course  Procedures   none  Imaging Review Dg Knee Complete 4 Views Left  04/08/2015   CLINICAL DATA:  Left knee pain for 1 month  EXAM: LEFT KNEE - COMPLETE 4+ VIEW  COMPARISON:  None.  FINDINGS: No acute fracture. No dislocation. Moderate tricompartment osteoarthritic change. Small joint effusion is noted.  IMPRESSION: No acute bony pathology.  Degenerative changes.   Electronically Signed   By: Jolaine Click M.D.   On: 04/08/2015 13:54     MDM   1. Primary osteoarthritis of left knee   2. Pes anserine bursitis    Knee sleeve dispensed.  Rx for Mobic 15mg  once daily. Apply ice pack for 20 to 30 minutes, 3 to 4 times daily  Continue until pain decreases.  Wear knee brace or knee sleeve daytime.  Begin range of motion and stretching exercises as tolerated. Followup with Dr. Rodney Langton or Dr. Clementeen [facility] ([facility]) if not improving about two weeks.     Lattie Haw, MD 04/09/15 2011

## 2015-04-08 NOTE — ED Notes (Signed)
Pt c/o left knee pain for one month.  No apparent injury, hurts worse after sitting for a long time.

## 2015-04-08 NOTE — Discharge Instructions (Signed)
Apply ice pack for 20 to 30 minutes, 3 to 4 times daily  Continue until pain decreases.  Wear knee brace or knee sleeve daytime.  Begin range of motion and stretching exercises as tolerated.

## 2015-04-12 ENCOUNTER — Other Ambulatory Visit: Payer: Self-pay | Admitting: Family Medicine

## 2015-04-12 DIAGNOSIS — N400 Enlarged prostate without lower urinary tract symptoms: Secondary | ICD-10-CM

## 2015-04-12 DIAGNOSIS — I10 Essential (primary) hypertension: Secondary | ICD-10-CM

## 2015-04-12 MED ORDER — DOXAZOSIN MESYLATE 4 MG PO TABS: 4.0000 mg | ORAL_TABLET | Freq: Every day | ORAL | Status: DC

## 2015-04-27 ENCOUNTER — Other Ambulatory Visit: Payer: Self-pay | Admitting: Family Medicine

## 2015-04-28 ENCOUNTER — Encounter: Payer: Self-pay | Admitting: Family Medicine

## 2015-04-28 ENCOUNTER — Ambulatory Visit (INDEPENDENT_AMBULATORY_CARE_PROVIDER_SITE_OTHER): Payer: 59 | Admitting: Family Medicine

## 2015-04-28 VITALS — BP 151/77 | HR 65 | Wt 246.0 lb

## 2015-04-28 DIAGNOSIS — Z23 Encounter for immunization: Secondary | ICD-10-CM

## 2015-04-28 DIAGNOSIS — I10 Essential (primary) hypertension: Secondary | ICD-10-CM | POA: Diagnosis not present

## 2015-04-28 DIAGNOSIS — M25562 Pain in left knee: Secondary | ICD-10-CM | POA: Diagnosis not present

## 2015-04-28 MED ORDER — ZOSTER VACCINE LIVE 19400 UNT/0.65ML ~~LOC~~ SOLR: 0.6500 mL | Freq: Once | SUBCUTANEOUS | Status: DC

## 2015-04-28 MED ORDER — AMLODIPINE BESYLATE 10 MG PO TABS
10.0000 mg | ORAL_TABLET | Freq: Every day | ORAL | Status: DC
Start: 2015-04-28 — End: 2015-10-09

## 2015-04-28 NOTE — Progress Notes (Signed)
CC: Danny Rogers is a 69 y.o. male is here for Hypertension   Subjective: HPI:  Follow-up hypertension: Earlier this summer he was prescribed amlodipine for uncontrolled hypertension. He received a letter in July which had some confusing wording and and he interpreted it as needing to take amlodipine twice a day. He's been doing this and blood pressures at home are in the normotensive range. He ran out of the medication early because he was taking it twice a day when it was only written for once a day. He denies chest pain shortness of breath orthopnea nor peripheral edema  Complains of left knee pain that was evaluated at urgent care last month. He was taking meloxicam however was causing intolerable lightheadedness. This side effect resolved after he stopped taking medication. He reports occasional swelling of the left knee. It's worse after periods of inactivity that has no mechanical symptoms such as locking catching or giving way. He denies any recent or remote trauma. Symptoms are interfering with quality of life to a mild degree.   Review Of Systems Outlined In HPI  Past Medical History  Diagnosis Date  . Hypertension   . Hyperlipidemia     Past Surgical History  Procedure Laterality Date  . Tonsillectomy    . Appendectomy     Family History  Problem Relation Age of Onset  . Hypertension Mother   . Stroke Father     Social History   Social History  . Marital Status: Married    Spouse Name: N/A  . Number of Children: N/A  . Years of Education: N/A   Occupational History  . Not on file.   Social History Main Topics  . Smoking status: Former Smoker    Quit date: 02/24/1988  . Smokeless tobacco: Not on file  . Alcohol Use: Yes     Comment: 2-3 weekly  . Drug Use: No  . Sexual Activity:    Partners: Female   Other Topics Concern  . Not on file   Social History Narrative     Objective: BP 151/77 mmHg  Pulse 65  Wt 246 lb (111.585 kg)  General: Alert  and Oriented, No Acute Distress HEENT: Pupils equal, round, reactive to light. Conjunctivae clear.  Moist mucous membranes Lungs: Clear to auscultation bilaterally, no wheezing/ronchi/rales.  Comfortable work of breathing. Good air movement. Cardiac: Regular rate and rhythm. Normal S1/S2.  No murmurs, rubs, nor gallops.   Left knee exam shows full-strength and range of motion. There is no swelling, redness, nor warmth overlying the knee.  No patellar crepitus. No patellar apprehension. No pain with palpation of the inferior patellar pole.  No pain or laxity with valgus nor varus stress.No medial or lateral joint line tenderness to palpation. Extremities: No peripheral edema.  Strong peripheral pulses.  Mental Status: No depression, anxiety, nor agitation. Skin: Warm and dry.  Assessment & Plan: Danny Rogers was seen today for hypertension.  Diagnoses and all orders for this visit:  Encounter for immunization  Essential hypertension, benign -     amLODipine (NORVASC) 10 MG tablet; Take 1 tablet (10 mg total) by mouth daily. -     zoster vaccine live, PF, (ZOSTAVAX) 16109 UNT/0.65ML injection; Inject 19,400 Units into the skin once.  Left knee pain  Other orders -     Flu Vaccine QUAD 36+ mos IM   Essential hypertension: Uncontrolled off of amlodipine, restart 10 mg daily, advised to only take once a day no need to take twice a day.  Left knee pain: He's tolerated Aleve in the past therefore restart Aleve 1 tablet twice a day. Offered cortisone shot today however he politely declines  Return in about 3 months (around 07/28/2015) for BP.

## 2015-05-11 ENCOUNTER — Other Ambulatory Visit: Payer: Self-pay | Admitting: Family Medicine

## 2015-08-15 ENCOUNTER — Other Ambulatory Visit: Payer: Self-pay | Admitting: Family Medicine

## 2015-08-15 ENCOUNTER — Other Ambulatory Visit: Payer: Self-pay

## 2015-08-15 DIAGNOSIS — N400 Enlarged prostate without lower urinary tract symptoms: Secondary | ICD-10-CM

## 2015-08-15 DIAGNOSIS — I10 Essential (primary) hypertension: Secondary | ICD-10-CM

## 2015-08-15 MED ORDER — DOXAZOSIN MESYLATE 4 MG PO TABS: 4.0000 mg | ORAL_TABLET | Freq: Every day | ORAL | Status: DC

## 2015-08-15 MED ORDER — LOSARTAN POTASSIUM-HCTZ 100-25 MG PO TABS: 1.0000 | ORAL_TABLET | Freq: Every day | ORAL | Status: DC

## 2015-09-07 ENCOUNTER — Ambulatory Visit (INDEPENDENT_AMBULATORY_CARE_PROVIDER_SITE_OTHER): Payer: 59 | Admitting: Family Medicine

## 2015-09-07 ENCOUNTER — Ambulatory Visit (INDEPENDENT_AMBULATORY_CARE_PROVIDER_SITE_OTHER): Payer: 59

## 2015-09-07 VITALS — BP 152/75 | HR 86 | Temp 98.7°F | Wt 244.0 lb

## 2015-09-07 DIAGNOSIS — J189 Pneumonia, unspecified organism: Secondary | ICD-10-CM | POA: Diagnosis not present

## 2015-09-07 DIAGNOSIS — R05 Cough: Secondary | ICD-10-CM

## 2015-09-07 DIAGNOSIS — R059 Cough, unspecified: Secondary | ICD-10-CM | POA: Insufficient documentation

## 2015-09-07 DIAGNOSIS — R0989 Other specified symptoms and signs involving the circulatory and respiratory systems: Secondary | ICD-10-CM

## 2015-09-07 MED ORDER — IPRATROPIUM BROMIDE 0.06 % NA SOLN: 2.0000 | Freq: Four times a day (QID) | NASAL | Status: DC

## 2015-09-07 MED ORDER — GUAIFENESIN-CODEINE 100-10 MG/5ML PO SOLN: 5.0000 mL | Freq: Every evening | ORAL | Status: DC | PRN

## 2015-09-07 MED ORDER — LEVOFLOXACIN 500 MG PO TABS: 500.0000 mg | ORAL_TABLET | Freq: Every day | ORAL | Status: DC

## 2015-09-07 NOTE — Progress Notes (Signed)
       Danny Rogers is a 70 y.o. male who presents to [facility]: Primary Care today for rhinorrhea and cough.  Patient has a 7 day history of sore throat and sneezing with development of hoarseness 5 days ago,  development of intermittent dry cough yesterday, intermittent chills, no documented fever or body aches. Patient also notes 4-6 weeks of ear pressure, more prominent on right side. Patient says cough and runny nose is bothering him most. Patient notes bilateral pain at the costal borders bilaterally that is worse when coughing. No dyspnea.    Past Medical History  Diagnosis Date  . Hypertension   . Hyperlipidemia    Past Surgical History  Procedure Laterality Date  . Tonsillectomy    . Appendectomy     Social History  Substance Use Topics  . Smoking status: Former Smoker    Quit date: 02/24/1988  . Smokeless tobacco: Not on file  . Alcohol Use: Yes     Comment: 2-3 weekly   family history includes Hypertension in his mother; Stroke in his father.  ROS as above Medications: Current Outpatient Prescriptions  Medication Sig Dispense Refill  . amLODipine (NORVASC) 10 MG tablet Take 1 tablet (10 mg total) by mouth daily. 90 tablet 3  . doxazosin (CARDURA) 4 MG tablet Take 1 tablet (4 mg total) by mouth at bedtime. Due for follow up appt with PCP. 30 tablet 0  . losartan-hydrochlorothiazide (HYZAAR) 100-25 MG tablet Take 1 tablet by mouth daily. Need follow up appointment for more refills 30 tablet 0  . potassium chloride (KLOR-CON) 8 MEQ tablet take 1 tablet by mouth twice a day 180 tablet 1  . pravastatin (PRAVACHOL) 10 MG tablet take 1 tablet by mouth once daily 90 tablet 1  . zoster vaccine live, PF, (ZOSTAVAX) [id number] UNT/0.65ML injection Inject 19,400 Units into the skin once. 1 each 0   No current facility-administered medications for this visit.   Allergies  Allergen Reactions   . Cephalosporins   . Meloxicam     lightheadedness  . Penicillins   . Sulfa Antibiotics      Exam:  BP 152/75 mmHg  Pulse 86  Temp(Src) 98.7 F (37.1 C) (Oral)  Wt 244 lb (110.678 kg)  SpO2 95% Gen: Ill but non-toxic appearing man in NAD HEENT: EOMI,  MMM. Erythematous tonsils without exudate. Bilateral cerumen impaction worse on right side.  Lungs: Normal work of breathing. Decreased breath sounds at base, more prominent on right. Equivocal egophony on R side. Dullness to percussion in the bases bilaterally.  Heart: RRR no MRG Abd: NABS, Soft. Nondistended, Nontender Exts: Brisk capillary refill, warm and well perfused.  Skin: multiple seborrheic keratoses on back   No results found for this or any previous visit (from the past 24 hour(s)). No results found.  CXR: Personally reviewed Alveolar infiltrates appreciated in the R lower lung field on PA view Lateral view shows infiltrate posterior to the cardiac shadow with air bronchogram noted within it consistent with the infiltrate noted on PA view  Please see individual assessment and plan sections.

## 2015-09-07 NOTE — Assessment & Plan Note (Addendum)
Most likely given time course of illness and chest x ray findings. Check CBC and BMP. Will reevaluate with radiologist read. Prescribed Levofloxacin 500 x 7  Days. Given history of renal insufficiency, will dose adjust if BMP shows further deteriorated kidney function. Prescribed Atrovent for rhinorrhea. Follow up if not improved in 7 days.

## 2015-09-07 NOTE — Patient Instructions (Signed)
Thank you for coming in today. You were seen today for your recent illness. You were found to have pneumonia. We will prescribe antibiotics. We will get bloodwork to ensure your kidneys can handle this medication. We will prescribe medications for the cough and runny nose. Come back if worsening symptoms or not better in one week.

## 2015-09-07 NOTE — Assessment & Plan Note (Addendum)
Likely secondary to the pneumonia, though non-productive. Prescribed cough syrup.

## 2015-09-08 LAB — BASIC METABOLIC PANEL
BUN: 17 mg/dL (ref 7–25)
CHLORIDE: 104 mmol/L (ref 98–110)
CO2: 32 mmol/L — AB (ref 20–31)
CREATININE: 1.42 mg/dL — AB (ref 0.70–1.18)
Calcium: 9 mg/dL (ref 8.6–10.3)
Glucose, Bld: 184 mg/dL — ABNORMAL HIGH (ref 65–99)
Potassium: 4 mmol/L (ref 3.5–5.3)
SODIUM: 140 mmol/L (ref 135–146)

## 2015-09-08 LAB — CBC
HCT: 39.5 % (ref 39.0–52.0)
Hemoglobin: 13.7 g/dL (ref 13.0–17.0)
MCH: 29.8 pg (ref 26.0–34.0)
MCHC: 34.7 g/dL (ref 30.0–36.0)
MCV: 86.1 fL (ref 78.0–100.0)
MPV: 10.6 fL (ref 8.6–12.4)
PLATELETS: 198 10*3/uL (ref 150–400)
RBC: 4.59 MIL/uL (ref 4.22–5.81)
RDW: 14.2 % (ref 11.5–15.5)
WBC: 13.4 10*3/uL — ABNORMAL HIGH (ref 4.0–10.5)

## 2015-09-08 NOTE — Progress Notes (Signed)
Quick Note:  Labs show evidence of pneumonia infection. Chest x-ray shows bronchitis possible pneumonia. Continue current treatment. Return if not better. ______

## 2015-10-02 ENCOUNTER — Other Ambulatory Visit: Payer: Self-pay

## 2015-10-02 DIAGNOSIS — N400 Enlarged prostate without lower urinary tract symptoms: Secondary | ICD-10-CM

## 2015-10-02 DIAGNOSIS — I10 Essential (primary) hypertension: Secondary | ICD-10-CM

## 2015-10-02 MED ORDER — DOXAZOSIN MESYLATE 4 MG PO TABS: 4.0000 mg | ORAL_TABLET | Freq: Every day | ORAL | Status: DC

## 2015-10-09 ENCOUNTER — Ambulatory Visit (INDEPENDENT_AMBULATORY_CARE_PROVIDER_SITE_OTHER): Payer: 59 | Admitting: Family Medicine

## 2015-10-09 ENCOUNTER — Encounter: Payer: Self-pay | Admitting: Family Medicine

## 2015-10-09 VITALS — BP 158/78 | HR 69 | Wt 236.0 lb

## 2015-10-09 DIAGNOSIS — S83412A Sprain of medial collateral ligament of left knee, initial encounter: Secondary | ICD-10-CM

## 2015-10-09 DIAGNOSIS — I10 Essential (primary) hypertension: Secondary | ICD-10-CM | POA: Diagnosis not present

## 2015-10-09 DIAGNOSIS — H918X1 Other specified hearing loss, right ear: Secondary | ICD-10-CM

## 2015-10-09 DIAGNOSIS — H6121 Impacted cerumen, right ear: Secondary | ICD-10-CM | POA: Diagnosis not present

## 2015-10-09 DIAGNOSIS — N400 Enlarged prostate without lower urinary tract symptoms: Secondary | ICD-10-CM | POA: Diagnosis not present

## 2015-10-09 MED ORDER — LOSARTAN POTASSIUM-HCTZ 100-25 MG PO TABS: 1.0000 | ORAL_TABLET | Freq: Every day | ORAL | Status: DC

## 2015-10-09 MED ORDER — AMLODIPINE BESYLATE 10 MG PO TABS: 10.0000 mg | ORAL_TABLET | Freq: Every day | ORAL | Status: DC

## 2015-10-09 MED ORDER — DOXAZOSIN MESYLATE 4 MG PO TABS: 4.0000 mg | ORAL_TABLET | Freq: Every day | ORAL | Status: DC

## 2015-10-09 NOTE — Progress Notes (Signed)
CC: Danny Rogers is a 70 y.o. male is here for Hypertension; Medication Refill; and Cerumen Impaction   Subjective: HPI:  Follow-up essential hypertension: Continues to take amlodipine and Hyzaar as prescribed, he ran out of doxazosin 2 days ago. No outside blood pressures to report. Denies chest pain shortness of breath orthopnea or peripheral edema.  Follow-up BPH: After running out of doxazosin he did not notice any new urinary symptoms. He's been out of this medication for 2 days. He tells me he is awakening only once at most to urinate at night and denies any urinary urgency or hesitancy.  Components of chronic medial knee pain. His worse going up and down stairs. It's greatly improved if using a TENS unit or Biofreeze. Symptoms been present to a mild degree for matter of years. Denies catching locking or giving way  Tells me he feels like his hearing out of the right ear is 50% less than that of his left. Denies any pain or facial pressure. He denies any dizziness or any other motor or sensory disturbances   Review Of Systems Outlined In HPI  Past Medical History  Diagnosis Date  . Hypertension   . Hyperlipidemia     Past Surgical History  Procedure Laterality Date  . Tonsillectomy    . Appendectomy     Family History  Problem Relation Age of Onset  . Hypertension Mother   . Stroke Father     Social History   Social History  . Marital Status: Married    Spouse Name: N/A  . Number of Children: N/A  . Years of Education: N/A   Occupational History  . Not on file.   Social History Main Topics  . Smoking status: Former Smoker    Quit date: 02/24/1988  . Smokeless tobacco: Not on file  . Alcohol Use: Yes     Comment: 2-3 weekly  . Drug Use: No  . Sexual Activity:    Partners: Female   Other Topics Concern  . Not on file   Social History Narrative     Objective: BP 158/78 mmHg  Pulse 69  Wt 236 lb (107.049 kg)  General: Alert and Oriented, No  Acute Distress HEENT: Pupils equal, round, reactive to light. Conjunctivae clear.  External ears unremarkable, left canal clear with intact TMs with appropriate landmarks, right ear canal is obscured with a cerumen impaction.  Middle ear appears open without effusion. Pink inferior turbinates.  Moist mucous membranes, pharynx without inflammation nor lesions.  Neck supple without palpable lymphadenopathy nor abnormal masses. Lungs: Clear to auscultation bilaterally, no wheezing/ronchi/rales.  Comfortable work of breathing. Good air movement. Cardiac: Regular rate and rhythm. Normal S1/S2.  No murmurs, rubs, nor gallops.   Extremities: No peripheral edema.  Strong peripheral pulses.  Mental Status: No depression, anxiety, nor agitation. Skin: Warm and dry.  Assessment & Plan: [place] was seen today for hypertension, medication refill and cerumen impaction.  Diagnoses and all orders for this visit:  Essential hypertension, benign -     doxazosin (CARDURA) 4 MG tablet; Take 1 tablet (4 mg total) by mouth at bedtime. -     amLODipine (NORVASC) 10 MG tablet; Take 1 tablet (10 mg total) by mouth daily. -     losartan-hydrochlorothiazide (HYZAAR) 100-25 MG tablet; Take 1 tablet by mouth daily.  BPH (benign prostatic hyperplasia) -     doxazosin (CARDURA) 4 MG tablet; Take 1 tablet (4 mg total) by mouth at bedtime.  Sprain of MCL joint  of knee, left, initial encounter  Hearing loss due to cerumen impaction, right    Essential hypertension: Uncontrolled due to running out of doxazosin, restarting former regimen continue amlodipine and Hyzaar. PPH: Controlled with doxazosin Sprain of the leftMCL: Start home rehabilitation plan, provided with exercises and encouraged to on a daily basis for next 2-3 weeks Hearing loss of the right ear: Resolved after removing the impaction.  Indication: Cerumen impaction of the right ear Medical necessity statement: On physical examination, cerumen impairs  clinically significant portions of the external auditory canal, and tympanic membrane. Noted obstructive, copious cerumen that cannot be removed without magnification and instrumentations requiring physician skills Consent: Discussed benefits and risks of procedure and verbal consent obtained Procedure: Patient was prepped for the procedure. Utilized an otoscope to assess and take note of the ear canal, the tympanic membrane, and the presence, amount, and placement of the cerumen. Gentle water irrigation and soft plastic curette was utilized to remove cerumen.  Post procedure examination: shows cerumen was completely removed. Patient tolerated procedure well. The patient is made aware that they may experience temporary vertigo, temporary hearing loss, and temporary discomfort. If these symptom last for more than 24 hours to call the clinic or proceed to the ED.  [phone]  Modifier 25  Return in about 3 months (around 01/06/2016).

## 2016-02-16 ENCOUNTER — Other Ambulatory Visit: Payer: Self-pay | Admitting: Family Medicine

## 2016-04-03 ENCOUNTER — Other Ambulatory Visit: Payer: Self-pay | Admitting: Family Medicine

## 2016-04-03 DIAGNOSIS — Z0100 Encounter for examination of eyes and vision without abnormal findings: Principal | ICD-10-CM

## 2016-04-03 DIAGNOSIS — E119 Type 2 diabetes mellitus without complications: Secondary | ICD-10-CM

## 2016-04-03 DIAGNOSIS — H269 Unspecified cataract: Secondary | ICD-10-CM

## 2016-04-08 ENCOUNTER — Other Ambulatory Visit: Payer: Self-pay | Admitting: Family Medicine

## 2016-04-08 DIAGNOSIS — I10 Essential (primary) hypertension: Secondary | ICD-10-CM

## 2016-04-11 DIAGNOSIS — H25812 Combined forms of age-related cataract, left eye: Secondary | ICD-10-CM | POA: Insufficient documentation

## 2016-04-18 DIAGNOSIS — H25811 Combined forms of age-related cataract, right eye: Secondary | ICD-10-CM | POA: Insufficient documentation

## 2016-04-29 ENCOUNTER — Ambulatory Visit (INDEPENDENT_AMBULATORY_CARE_PROVIDER_SITE_OTHER): Payer: 59 | Admitting: Family Medicine

## 2016-04-29 ENCOUNTER — Encounter: Payer: Self-pay | Admitting: Family Medicine

## 2016-04-29 VITALS — BP 141/64 | HR 60 | Wt 240.0 lb

## 2016-04-29 DIAGNOSIS — J189 Pneumonia, unspecified organism: Secondary | ICD-10-CM | POA: Diagnosis not present

## 2016-04-29 DIAGNOSIS — Z1211 Encounter for screening for malignant neoplasm of colon: Secondary | ICD-10-CM

## 2016-04-29 DIAGNOSIS — R972 Elevated prostate specific antigen [PSA]: Secondary | ICD-10-CM

## 2016-04-29 DIAGNOSIS — E119 Type 2 diabetes mellitus without complications: Secondary | ICD-10-CM

## 2016-04-29 DIAGNOSIS — I10 Essential (primary) hypertension: Secondary | ICD-10-CM | POA: Diagnosis not present

## 2016-04-29 DIAGNOSIS — E785 Hyperlipidemia, unspecified: Secondary | ICD-10-CM

## 2016-04-29 DIAGNOSIS — Z23 Encounter for immunization: Secondary | ICD-10-CM | POA: Diagnosis not present

## 2016-04-29 DIAGNOSIS — N400 Enlarged prostate without lower urinary tract symptoms: Secondary | ICD-10-CM | POA: Diagnosis not present

## 2016-04-29 DIAGNOSIS — N289 Disorder of kidney and ureter, unspecified: Secondary | ICD-10-CM

## 2016-04-29 MED ORDER — AMLODIPINE BESYLATE 10 MG PO TABS: 10.0000 mg | ORAL_TABLET | Freq: Every day | ORAL | 3 refills | Status: AC

## 2016-04-29 MED ORDER — ZOSTER VACCINE LIVE 19400 UNT/0.65ML ~~LOC~~ SUSR: 0.6500 mL | Freq: Once | SUBCUTANEOUS | 0 refills | Status: AC

## 2016-04-29 MED ORDER — DOXAZOSIN MESYLATE 4 MG PO TABS: 4.0000 mg | ORAL_TABLET | Freq: Every day | ORAL | 1 refills | Status: AC

## 2016-04-29 MED ORDER — IPRATROPIUM BROMIDE 0.06 % NA SOLN: 2.0000 | Freq: Four times a day (QID) | NASAL | 12 refills | Status: AC

## 2016-04-29 MED ORDER — LOSARTAN POTASSIUM-HCTZ 100-25 MG PO TABS: 1.0000 | ORAL_TABLET | Freq: Every day | ORAL | 1 refills | Status: DC

## 2016-04-29 MED ORDER — POTASSIUM CHLORIDE ER 8 MEQ PO TBCR: 8.0000 meq | EXTENDED_RELEASE_TABLET | Freq: Two times a day (BID) | ORAL | 1 refills | Status: AC

## 2016-04-29 NOTE — Progress Notes (Signed)
Danny KrebsRobert Van Rogers is a 70 y.o. male who [facility] today for follow-up hypertension hyperlipidemia and diabetes.  Hypertension: Patient is doing reasonably well with the below medication. No chest pains palpitations shortness of breath.  Diabetes: Patient was unaware that he has diabetes. His last A1c was several years ago with elevated 6.6. He denies any polyuria or polydipsia.  Elevated PSA: Patient has had slightly elevated PSAs in the past. Has been some time since his last PSA was checked. He denies any significant urinary symptoms.  Hyperlipidemia: Patient has run out of pravastatin about a week ago. He notes he was tolerating this medication well with no muscle aches and pains.  Patient recently had cataract surgery bilaterally.   Past Medical History:  Diagnosis Date  . Hyperlipidemia   . Hypertension    Past Surgical History:  Procedure Laterality Date  . APPENDECTOMY    . TONSILLECTOMY     Social History  Substance Use Topics  . Smoking status: Former Smoker    Quit date: 02/24/1988  . Smokeless tobacco: Not on file  . Alcohol use Yes     Comment: 2-3 weekly   family history includes Hypertension in his mother; Stroke in his father.  ROS as above:  Medications: Current Outpatient Prescriptions  Medication Sig Dispense Refill  . ketorolac (ACULAR) 0.5 % ophthalmic solution instill 1 drop into affected eye four times a day    . moxifloxacin (VIGAMOX) 0.5 % ophthalmic solution instill 1 drop four times a day INTO OPERATIVE EYE BEGINNING 3 DAYS PRIOR TO SURGERY    . amLODipine (NORVASC) 10 MG tablet Take 1 tablet (10 mg total) by mouth daily. 90 tablet 3  . doxazosin (CARDURA) 4 MG tablet Take 1 tablet (4 mg total) by mouth at bedtime. 90 tablet 1  . ipratropium (ATROVENT) 0.06 % nasal spray Place 2 sprays into both nostrils 4 (four)  times daily. 15 mL 12  . losartan-hydrochlorothiazide (HYZAAR) 100-25 MG tablet Take 1 tablet by mouth daily. NEED FOLLOW UP APPOINTMENT FOR MORE REFILLS 90 tablet 1  . potassium chloride (KLOR-CON) 8 MEQ tablet Take 1 tablet (8 mEq total) by mouth 2 (two) times daily. APPOINTMENT NEEDED FOR FURTHER REFILLS 180 tablet 1  . pravastatin (PRAVACHOL) 10 MG tablet Take 1 tablet (10 mg total) by mouth daily. APPOINTMENT NEEDED FOR FURTHER REFILLS 30 tablet 0  . prednisoLONE acetate (PRED FORTE) 1 % ophthalmic suspension Place 1 drop into the right eye 4 (four) times daily. START 3 DAYS PRIOR TO SURGERY RIGHT EYE    . Zoster Vaccine Live, PF, (ZOSTAVAX) [id number] UNT/0.65ML injection Inject 19,400 Units into the skin once. If given in pharmacy fax report to Dr Denyse Amassorey [phone] 1 each 0   No current facility-administered medications for this visit.    Allergies  Allergen Reactions  . Cephalosporins   . Meloxicam     lightheadedness  . Penicillins   . Sulfa Antibiotics   . Tetanus Toxoids      Exam:  BP (!) 141/64   Pulse 60   Wt 240 lb (108.9 kg)   BMI 34.44 kg/m  Gen: Well NAD HEENT: EOMI,  MMM Lungs: Normal work of breathing. CTABL Heart: RRR no MRG Abd: NABS, Soft. Nondistended, Nontender Exts: Brisk capillary refill, warm and well perfused.  Feet bilaterally have thickened elongated toenails. Diabetic foot exam is otherwise unremarkable.  No results found for this or any previous visit (  from the past 24 hour(s)). No results found.    Assessment and Plan: 70 y.o. male with  1) hypertension: Doing reasonably well. Check basic labs.  2) diabetes: Catching up with screening. Check A1c. Refer to podiatry for second toenails. Check with ophthalmology for records regarding diabetic eye exams.  3) elevated PSA: Recheck PSA.  4) lipids: Check fasting lipids. Will prescribe pravastatin based on results  5) health maintenance: Pneumonia and influenza vaccines given. Zoster vaccine  prescribed. Referred to gastroenterology for colonoscopy. Hepatitis C screening added.  Return in 3 months or sooner if needed.   Orders Placed This Encounter  Procedures  . Pneumococcal conjugate vaccine 13-valent IM  . CBC  . Comprehensive metabolic panel    Order Specific Question:   Has the patient fasted?    Answer:   No  . Hemoglobin A1c  . Hepatitis C antibody  . VITAMIN D 25 Hydroxy (Vit-D Deficiency, Fractures)  . TSH  . PSA  . Lipid panel    Order Specific Question:   Has the patient fasted?    Answer:   No  . Ambulatory referral to Gastroenterology    Referral Priority:   Routine    Referral Type:   Consultation    Referral Reason:   Specialty Services Required    Requested Specialty:   Gastroenterology    Number of Visits Requested:   1  . Ambulatory referral to Podiatry    Referral Priority:   Routine    Referral Type:   Consultation    Referral Reason:   Specialty Services Required    Requested Specialty:   Podiatry    Number of Visits Requested:   1    Discussed warning signs or symptoms. Please see discharge instructions. Patient expresses understanding.

## 2016-04-29 NOTE — Patient Instructions (Signed)
Thank you for coming in today. Get fasting labs soon.  You should hear from digestive health and podiatry soon.  Return in 3 months.

## 2016-05-06 ENCOUNTER — Encounter: Payer: Self-pay | Admitting: Podiatry

## 2016-05-06 ENCOUNTER — Ambulatory Visit (INDEPENDENT_AMBULATORY_CARE_PROVIDER_SITE_OTHER): Payer: 59 | Admitting: Podiatry

## 2016-05-06 VITALS — BP 153/82 | HR 61 | Ht 70.0 in | Wt 230.0 lb

## 2016-05-06 DIAGNOSIS — B351 Tinea unguium: Secondary | ICD-10-CM | POA: Diagnosis not present

## 2016-05-06 DIAGNOSIS — M79606 Pain in leg, unspecified: Secondary | ICD-10-CM | POA: Diagnosis not present

## 2016-05-06 NOTE — Patient Instructions (Signed)
Seen for hypertrophic nails. All nails debrided. Return in 3 months or as needed.  

## 2016-05-06 NOTE — Progress Notes (Signed)
SUBJECTIVE: 70 y.o. year old male presents with problematic toe nails. They are thick and painful in shoes. Patient is referred by Dr. Denyse Amassorey.   REVIEW OF SYSTEMS: Pertinent items noted in HPI and remainder of comprehensive ROS otherwise negative.  OBJECTIVE: DERMATOLOGIC EXAMINATION: Nails: Thick yellow and deformed nails x 10.   VASCULAR EXAMINATION OF LOWER LIMBS: Pedal pulses: All pedal pulses are palpable with normal pulsation.  Capillary Filling times within 3 seconds in all digits.  No edema or erythema noted in foot. Temperature gradient from tibial crest to dorsum of foot is within normal bilateral.  NEUROLOGIC EXAMINATION OF THE LOWER LIMBS: All epicritic and tactile sensations grossly intact.  Sharp and Dull discriminatory sensations at the plantar ball of hallux is intact bilateral.   MUSCULOSKELETAL EXAMINATION: Rectus foot without gross deformities.   ASSESSMENT: Onychomycosis x 10. Pain in lower limb.   PLAN: Clinical findings and available treatment options reviewed. All nails debrided. Return in 3 months for routine foot care.

## 2016-05-07 LAB — COMPREHENSIVE METABOLIC PANEL
ALK PHOS: 70 U/L (ref 40–115)
ALT: 45 U/L (ref 9–46)
AST: 24 U/L (ref 10–35)
Albumin: 4.1 g/dL (ref 3.6–5.1)
BILIRUBIN TOTAL: 0.8 mg/dL (ref 0.2–1.2)
BUN: 17 mg/dL (ref 7–25)
CALCIUM: 9.3 mg/dL (ref 8.6–10.3)
CO2: 26 mmol/L (ref 20–31)
Chloride: 105 mmol/L (ref 98–110)
Creat: 1.35 mg/dL — ABNORMAL HIGH (ref 0.70–1.18)
Glucose, Bld: 113 mg/dL — ABNORMAL HIGH (ref 65–99)
POTASSIUM: 3.8 mmol/L (ref 3.5–5.3)
Sodium: 142 mmol/L (ref 135–146)
TOTAL PROTEIN: 6.3 g/dL (ref 6.1–8.1)

## 2016-05-07 LAB — HEMOGLOBIN A1C
HEMOGLOBIN A1C: 6 % — AB (ref <5.7)
Mean Plasma Glucose: 126 mg/dL

## 2016-05-07 LAB — CBC
HEMATOCRIT: 42.2 % (ref 38.5–50.0)
Hemoglobin: 14.2 g/dL (ref 13.2–17.1)
MCH: 29.6 pg (ref 27.0–33.0)
MCHC: 33.6 g/dL (ref 32.0–36.0)
MCV: 87.9 fL (ref 80.0–100.0)
MPV: 11.4 fL (ref 7.5–12.5)
PLATELETS: 207 10*3/uL (ref 140–400)
RBC: 4.8 MIL/uL (ref 4.20–5.80)
RDW: 14.5 % (ref 11.0–15.0)
WBC: 6.7 10*3/uL (ref 3.8–10.8)

## 2016-05-07 LAB — PSA: PSA: 2 ng/mL (ref <=4.0)

## 2016-05-07 LAB — LIPID PANEL
CHOL/HDL RATIO: 6.4 ratio — AB (ref <=5.0)
CHOLESTEROL: 192 mg/dL (ref 125–200)
HDL: 30 mg/dL — AB (ref >=40)
LDL Cholesterol: 117 mg/dL (ref <130)
Triglycerides: 227 mg/dL — ABNORMAL HIGH (ref <150)
VLDL: 45 mg/dL — ABNORMAL HIGH (ref <30)

## 2016-05-07 LAB — HEPATITIS C ANTIBODY: HCV Ab: NEGATIVE

## 2016-05-07 LAB — TSH: TSH: 1.72 mIU/L (ref 0.40–4.50)

## 2016-05-07 LAB — VITAMIN D 25 HYDROXY (VIT D DEFICIENCY, FRACTURES): VIT D 25 HYDROXY: 33 ng/mL (ref 30–100)

## 2016-05-07 MED ORDER — PRAVASTATIN SODIUM 40 MG PO TABS: 40.0000 mg | ORAL_TABLET | Freq: Every day | ORAL | 0 refills | Status: AC

## 2016-05-07 NOTE — Addendum Note (Signed)
Addended by: Rodolph BongOREY, Reem Fleury S on: 05/07/2016 06:39 AM   Modules accepted: Orders

## 2016-07-01 ENCOUNTER — Telehealth: Payer: Self-pay

## 2016-07-01 NOTE — Telephone Encounter (Signed)
Pt called stating that he had been advised by [employer] that [facility] providers are not in his network and wanted Dr. Denyse Amassorey to call and be added to the Network. Advised pt that he should see a provider in the network as provider network relationships are more complicated than a phone call. Pt verbalized understanding and advised that [employer] had referred him to a provider in his network.

## 2016-07-29 ENCOUNTER — Ambulatory Visit: Payer: 59 | Admitting: Family Medicine

## 2016-08-05 ENCOUNTER — Ambulatory Visit (INDEPENDENT_AMBULATORY_CARE_PROVIDER_SITE_OTHER): Payer: 59 | Admitting: Podiatry

## 2016-08-05 ENCOUNTER — Encounter: Payer: Self-pay | Admitting: Podiatry

## 2016-08-05 DIAGNOSIS — M79673 Pain in unspecified foot: Secondary | ICD-10-CM | POA: Diagnosis not present

## 2016-08-05 DIAGNOSIS — M79606 Pain in leg, unspecified: Secondary | ICD-10-CM

## 2016-08-05 DIAGNOSIS — B351 Tinea unguium: Secondary | ICD-10-CM

## 2016-08-05 NOTE — Patient Instructions (Addendum)
Seen for hypertrophic nails. All nails are very deformed, thick and hard. All nails debrided. Return in 3 months or as needed.

## 2016-08-05 NOTE — Progress Notes (Signed)
SUBJECTIVE: 70 y.o. year old male presents with problematic toe nails. They are thick and painful in shoes.   OBJECTIVE: DERMATOLOGIC EXAMINATION: Thick yellow and deformed nails x 10.   VASCULAR EXAMINATION OF LOWER LIMBS: All pedal pulses are palpable with normal pulsation except left foot dorsalis pedis.  Capillary Filling times within 3 seconds in all digits.  No sign of trophic changes. No edema or erythema noted in foot. Temperature gradient from tibial crest to dorsum of foot is within normal bilateral.  NEUROLOGIC EXAMINATION OF THE LOWER LIMBS: All epicritic and tactile sensations grossly intact.  Sharp and Dull discriminatory sensations at the plantar ball of hallux is intact bilateral.   MUSCULOSKELETAL EXAMINATION: Rectus foot without gross deformities.   ASSESSMENT: Onychomycosis x 10. Pain in lower limb.   PLAN: Clinical findings and available treatment options reviewed. All nails debrided. Trimming was painful to patient and need be soaked before trimming on next visit. Return in 3 months for routine foot care.

## 2016-08-06 IMAGING — CR DG CHEST 2V
2 series · 2 of 2 positions shown · non-contrast
Comparison: None.

CLINICAL DATA: Cough and chest congestion for the past week.
Intermittent fever. Ex-smoker.

EXAM:
CHEST  2 VIEW

[chest pa]
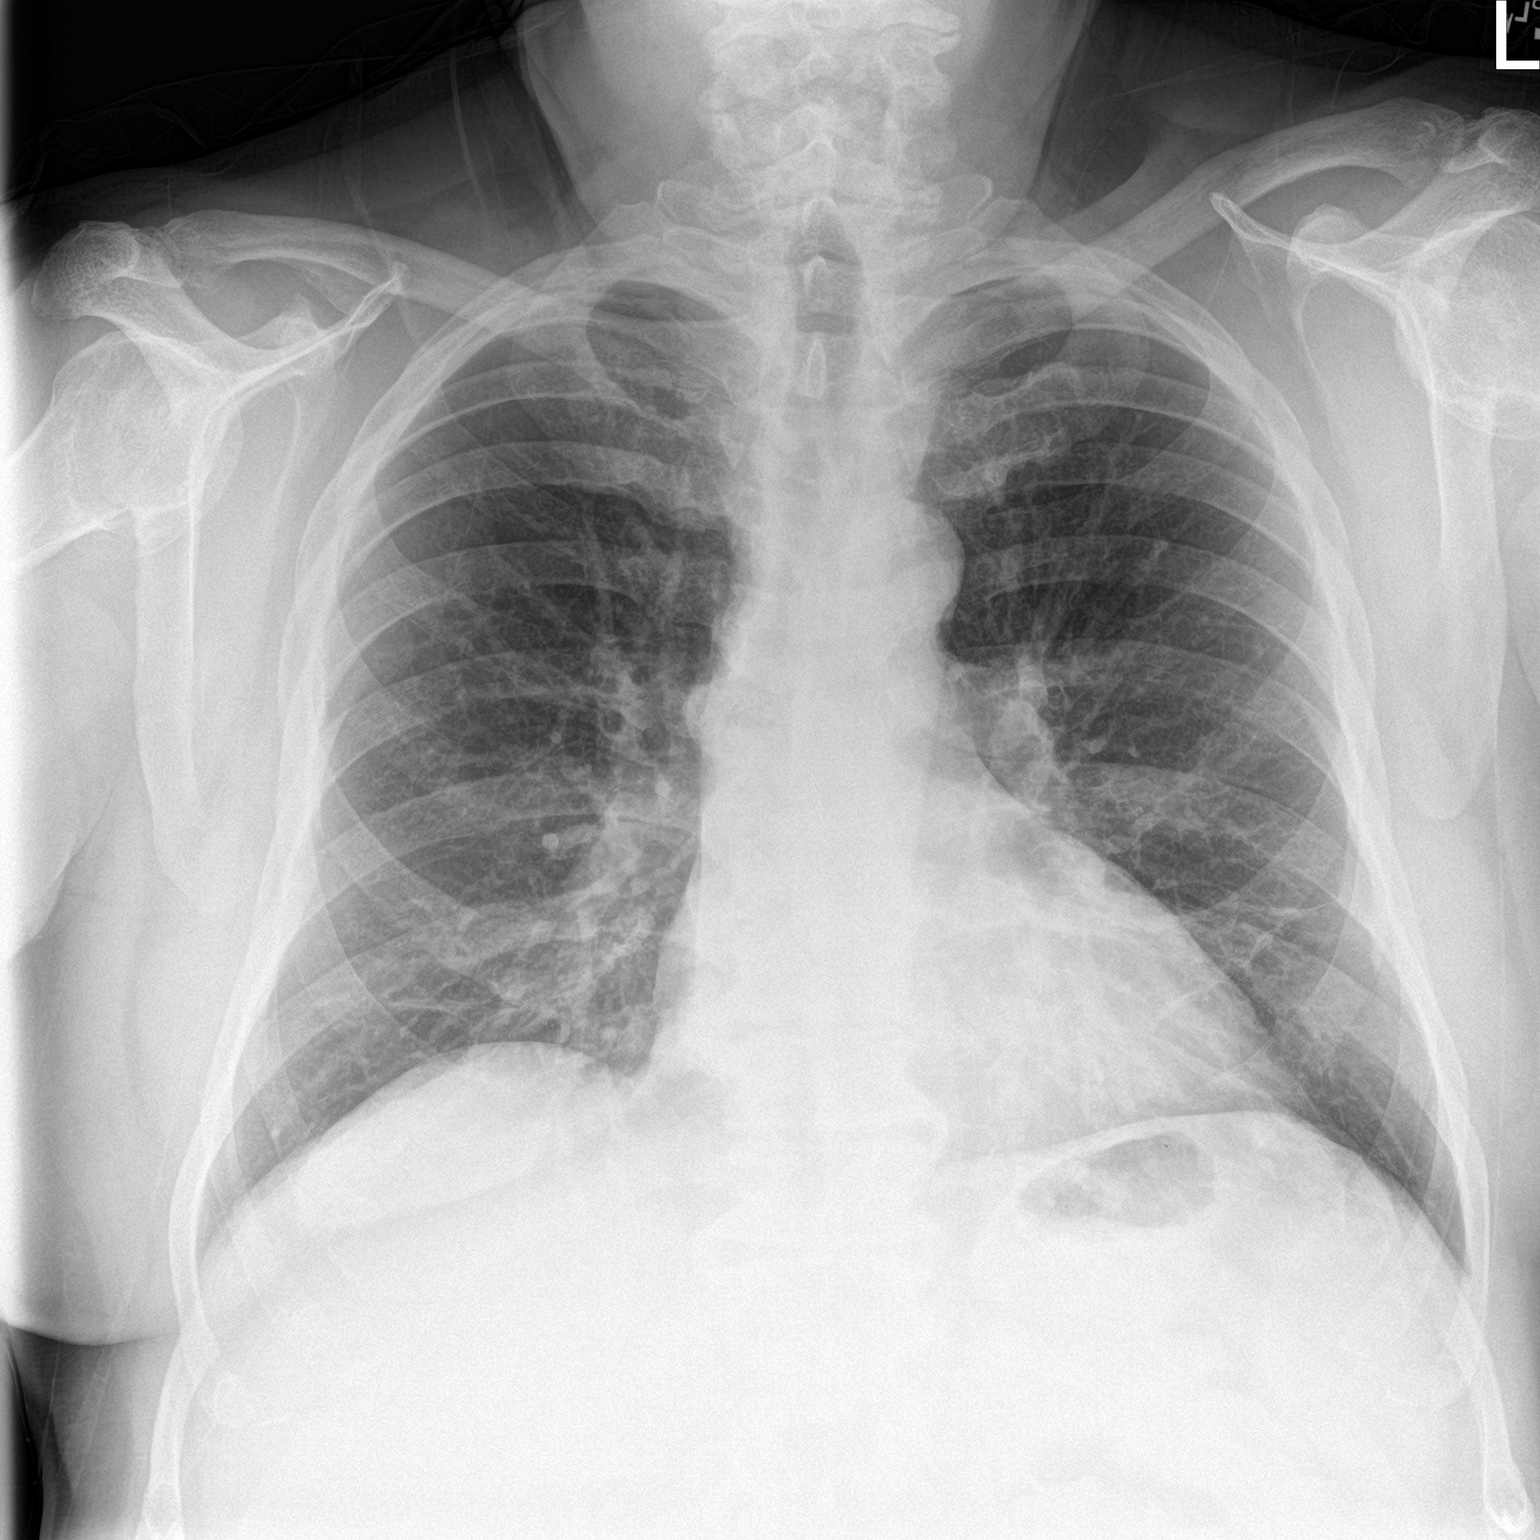

[chest lat]
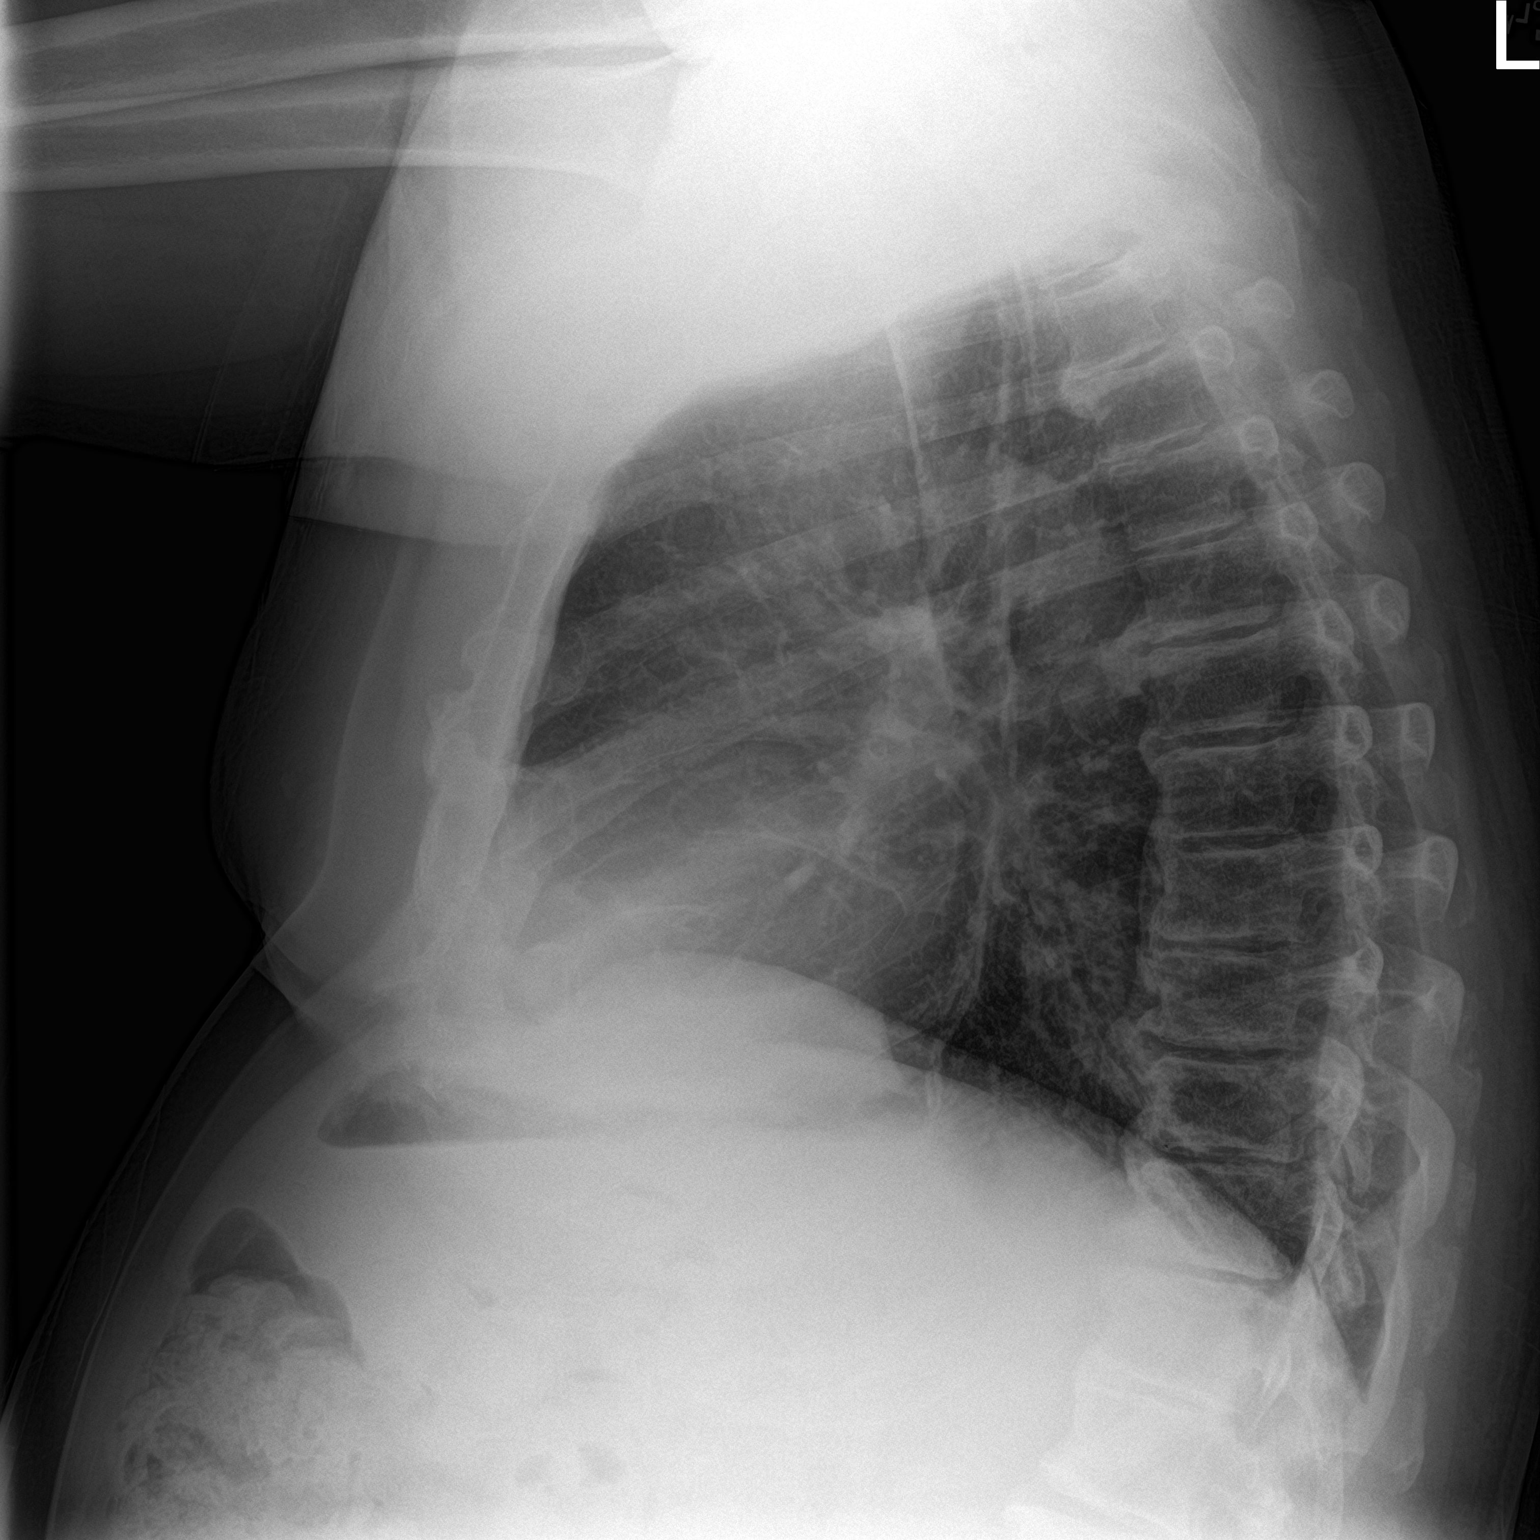

[2 of 2 positions shown; findings below may reference images not displayed]

FINDINGS: Normal sized heart. Clear lungs. Mild to moderate central
peribronchial thickening. Thoracic spine degenerative changes,
including changes of DISH. Lower cervical spine degenerative
changes.
IMPRESSION: Mild to moderate bronchitic changes.

## 2016-10-26 ENCOUNTER — Other Ambulatory Visit: Payer: Self-pay | Admitting: Family Medicine

## 2016-10-26 DIAGNOSIS — I10 Essential (primary) hypertension: Secondary | ICD-10-CM

## 2016-10-28 ENCOUNTER — Ambulatory Visit: Payer: 59 | Admitting: Podiatry
